# Patient Record
Sex: Male | Born: 1970 | Race: White | Hispanic: No | Marital: Married | Smoking: Former smoker
Health system: Southern US, Community
[De-identification: ages and names within clinical notes are randomized; demographics above are authoritative.]

## PROBLEM LIST (undated history)

## (undated) DIAGNOSIS — Z789 Other specified health status: Secondary | ICD-10-CM

## (undated) HISTORY — PX: FRACTURE SURGERY: SHX138

## (undated) HISTORY — PX: HERNIA REPAIR: SHX51

---

## 2016-04-22 ENCOUNTER — Encounter: Payer: Self-pay | Admitting: Family Medicine

## 2016-04-22 ENCOUNTER — Ambulatory Visit (INDEPENDENT_AMBULATORY_CARE_PROVIDER_SITE_OTHER): Payer: BLUE CROSS/BLUE SHIELD | Admitting: Family Medicine

## 2016-04-22 ENCOUNTER — Ambulatory Visit (INDEPENDENT_AMBULATORY_CARE_PROVIDER_SITE_OTHER)
Admission: RE | Admit: 2016-04-22 | Discharge: 2016-04-22 | Disposition: A | Discharge status: Home / Self Care | Payer: BLUE CROSS/BLUE SHIELD | Source: Ambulatory Visit | Attending: Family Medicine | Admitting: Family Medicine

## 2016-04-22 VITALS — BP 110/80 | HR 80 | Temp 98.0°F | Ht 67.75 in | Wt 194.8 lb

## 2016-04-22 DIAGNOSIS — M25512 Pain in left shoulder: Secondary | ICD-10-CM

## 2016-04-22 NOTE — Progress Notes (Signed)
Pre visit review using our clinic review tool, if applicable. No additional management support is needed unless otherwise documented below in the visit note. 

## 2016-04-22 NOTE — Progress Notes (Signed)
Dr. Frederico Hamman T. Amarri Satterly, MD, [facility] [place], [address] Phone: [phone] Fax: [phone]  04/22/2016  Patient: David Norton, MRN: [id number], DOB: 03-09-1971, 45 y.o.  Primary Physician:  No primary care provider on file.   Chief Complaint  Patient presents with  . Shoulder Pain    Left   Subjective:   This 45 y.o. male patient noted above presents with shoulder pain that has been ongoing for 2 mo there is no history of trauma or accident recently The patient denies neck pain or radicular symptoms. Denies dislocation, subluxation, separation of the shoulder. The patient does complain of pain in the overhead plane with significant painful arc of motion.  Shoulder - hurt with external roation. Resting it is fine. Some pain in the front and has normal range of motion. No acute injury. 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   Will have to prop up his L shoulder.  6 weeks of persistent pain. Had some minor pain before then.   Medications Tried: Tylenol, NSAIDS Ice or Heat: minimally helpful Tried PT: No  Prior shoulder Injury: No Prior surgery: No Prior fracture: No  The PMH, PSH, Social History, Family History, Medications, and allergies have been reviewed in [facility], and have been updated if relevant.  There are no active problems to display for this patient.   History reviewed. No pertinent past medical history.  Past Surgical History  Procedure Laterality Date  . Hernia repair    . Fracture surgery      Social History   Social History  . Marital Status: Married    Spouse Name: N/A  . Number of Children: N/A  . Years of Education: N/A   Occupational History  . Not on file.   Social History Main Topics  . Smoking status: Former Research scientist (life sciences)  . Smokeless tobacco: Never Used  . Alcohol Use: 0.0 oz/week    0 Standard drinks or equivalent per week     Comment: rarely    . Drug Use: No  . Sexual Activity:    Partners: Female   Other Topics Concern  . Not on file   Social History Narrative   Bobybuilder and weightlifter   Married   Therapist, occupational business    Family History  Problem Relation Age of Onset  . Thyroid disease Mother   . Lung cancer Maternal Grandmother     No Known Allergies  Medication list reviewed and updated in full in [place].  GEN: No fevers, chills. Nontoxic. Primarily MSK c/o today. MSK: Detailed in the HPI GI: tolerating PO intake without difficulty Neuro: No numbness, parasthesias, or tingling associated. Otherwise the pertinent positives of the ROS are noted above.   Objective:   Blood pressure 110/80, pulse 80, temperature 98 F (36.7 C), temperature source Oral, height 5' 7.75" (1.721 m), weight 194 lb 12 oz (88.338 kg).  GEN: Well-developed,well-nourished,in no acute distress; alert,appropriate and cooperative throughout examination HEENT: Normocephalic and atraumatic without obvious abnormalities. Ears, externally no deformities PULM: Breathing comfortably in no respiratory distress EXT: No clubbing, cyanosis, or edema PSYCH: Normally interactive. Cooperative during the interview. Pleasant. Friendly and conversant. Not anxious or depressed appearing. Normal, full affect.  Shoulder: LEFT Inspection: No muscle wasting or winging Ecchymosis/edema: neg  AC joint, scapula, clavicle: NT Cervical spine: NT, full ROM Spurling's: neg Abduction: full, 5/5 Flexion: full, 5/5 IR, full, lift-off: 5/5 ER at neutral:  full, 5/5 AC crossover: neg Neer: neg Hawkins: pos Drop Test: neg Empty Can: pos Supraspinatus insertion: mild-mod T Bicipital groove: NT Speed's: neg Yergason's: neg OBRIENS NEG Sulcus sign: neg Scapular dyskinesis: When lowering the arms, there is a notable scapular shimmy and winging. Minimal winging but mild winging on pushup. 4 sloping scapula, tight pectoralis minor.  On the  left, patient does have decreased internal range of motion. Compared to the right. He also has relatively decreased external range of motion. This approaches the terminal end point of the contralateral side.  Lower traps and rhomboids are weak on the left compared to the right. 4 minus/5 strength. C5-T1 intact  Neuro: Sensation intact Grip 5/5   Radiology: Dg Shoulder Left  04/22/2016  CLINICAL DATA:  Left shoulder pain without history of trauma EXAM: LEFT SHOULDER - 2+ VIEW COMPARISON:  None in PACs FINDINGS: The bones are subjectively adequately mineralized. The glenohumeral joint space is preserved. The [facility] joint is unremarkable. The subacromial subdeltoid space is preserved. There is no acute fracture nor dislocation. IMPRESSION: There is no acute bony abnormality of the left shoulder. If rotator cuff or glenoid labral pathology is suspected clinically, MRI would be the most useful next imaging step. Electronically Signed   By: David  Martinique M.D.   On: 04/22/2016 13:11    Assessment and Plan:    Left shoulder pain - Plan: DG Shoulder Left  Rotator cuff tendinopathy with subacromial bursitis. 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  I reviewed some range of motion with the patient including pectoralis minor stretching and sleep are stretching as well as capsular stretching. I'm also going to have him do Blackburn's at least twice a week.   Follow-up: Return in about 3 weeks (around 05/13/2016).  Orders Placed This Encounter  Procedures  . DG Shoulder Left    Signed,  Frederico Hamman T. Morena Mckissack, MD   Patient's Medications   No medications on file

## 2016-05-12 ENCOUNTER — Ambulatory Visit: Payer: BLUE CROSS/BLUE SHIELD | Admitting: Family Medicine

## 2016-05-17 ENCOUNTER — Encounter: Payer: Self-pay | Admitting: Family Medicine

## 2016-05-17 ENCOUNTER — Ambulatory Visit (INDEPENDENT_AMBULATORY_CARE_PROVIDER_SITE_OTHER): Payer: BLUE CROSS/BLUE SHIELD | Admitting: Family Medicine

## 2016-05-17 VITALS — BP 104/60 | HR 70 | Temp 98.2°F | Ht 67.75 in | Wt 189.2 lb

## 2016-05-17 DIAGNOSIS — M25512 Pain in left shoulder: Secondary | ICD-10-CM | POA: Diagnosis not present

## 2016-05-17 NOTE — Progress Notes (Signed)
Pre visit review using our clinic review tool, if applicable. No additional management support is needed unless otherwise documented below in the visit note. 

## 2016-05-17 NOTE — Progress Notes (Signed)
Dr. Frederico Hamman T. Soraida Vickers, MD, [facility] and [facility] Medicine [place], [address] Phone: [phone] Fax: [phone]  05/17/2016  Patient: David Norton, MRN: [id number], DOB: [birth year], 45 y.o.  Primary Physician:  No primary care provider on file.   Chief Complaint  Patient presents with  . Follow-up    Left shoulder   Subjective:   F/u L shoulder:  L shoulder still hurting a lot ROM is a little better.  Chest workout bothering him a lot.   He tells me that his shoulder has felt unstable at times it felt like it was going to pop out of socket. These are with particular motions with abduction and in chest flexion. He has not had a true dislocation. He has primarily had pain doing significant chest workouts. He also has had some pain doing bicep workouts while on an inclined bench.  04/22/2016 Last OV with Owens Loffler, MD  This 45 y.o. male patient noted above presents with shoulder pain that has been ongoing for 2 mo there is no history of trauma or accident recently The patient denies neck pain or radicular symptoms. Denies dislocation, subluxation, separation of the shoulder. The patient does complain of pain in the overhead plane with significant painful arc of motion.  Shoulder - hurt with external roation. Resting it is fine. Some pain in the front and has normal range of motion. No acute injury. 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   Will have to prop up his L shoulder.  6 weeks of persistent pain. Had some minor pain before then.   Medications Tried: Tylenol, NSAIDS Ice or Heat: minimally helpful Tried PT: No  Prior shoulder Injury: No Prior surgery: No Prior fracture: No  The PMH, PSH, Social History, Family History, Medications, and allergies have been reviewed in [facility], and have been updated if relevant.  There are no active problems to display for this patient.   No past medical  history on file.  Past Surgical History  Procedure Laterality Date  . Hernia repair    . Fracture surgery      Social History   Social History  . Marital Status: Married    Spouse Name: N/A  . Number of Children: N/A  . Years of Education: N/A   Occupational History  . Not on file.   Social History Main Topics  . Smoking status: Former Research scientist (life sciences)  . Smokeless tobacco: Never Used  . Alcohol Use: 0.0 oz/week    0 Standard drinks or equivalent per week     Comment: rarely  . Drug Use: No  . Sexual Activity:    Partners: Female   Other Topics Concern  . Not on file   Social History Narrative   Bobybuilder and weightlifter   Married   Therapist, occupational business    Family History  Problem Relation Age of Onset  . Thyroid disease Mother   . Lung cancer Maternal Grandmother     No Known Allergies  Medication list reviewed and updated in full in [place].  GEN: No fevers, chills. Nontoxic. Primarily MSK c/o today. MSK: Detailed in the HPI GI: tolerating PO intake without difficulty Neuro: No numbness, parasthesias, or tingling associated. Otherwise the pertinent positives of the ROS are noted above.   Objective:   Blood pressure 104/60, pulse 70, temperature 98.2 F (36.8 C), temperature source Oral, height 5' 7.75" (1.721 m), weight 189 lb 4  oz (85.843 kg).  GEN: Well-developed,well-nourished,in no acute distress; alert,appropriate and cooperative throughout examination HEENT: Normocephalic and atraumatic without obvious abnormalities. Ears, externally no deformities PULM: Breathing comfortably in no respiratory distress EXT: No clubbing, cyanosis, or edema PSYCH: Normally interactive. Cooperative during the interview. Pleasant. Friendly and conversant. Not anxious or depressed appearing. Normal, full affect.  Shoulder: LEFT Inspection: No muscle wasting or winging Ecchymosis/edema: neg  AC joint, scapula, clavicle: NT Cervical spine: NT, full  ROM Spurling's: neg Abduction: full, 5/5 Flexion: full, 5/5 IR, full, lift-off: 5/5 ER at neutral: full, 5/5 AC crossover: neg Neer: neg Hawkins: pos Drop Test: neg Empty Can: pos Supraspinatus insertion: mild-mod T Bicipital groove: NT Speed's: neg Yergason's: neg OBRIENS markedly positive on the LEFT Sulcus sign: neg Scapular dyskinesis: When lowering the arms, there is a notable scapular shimmy and winging.  On the left, patient does have decreased internal range of motion. Compared to the right. He also has relatively decreased external range of motion. This approaches the terminal end point of the contralateral side.  Lower traps and rhomboids are weak on the left compared to the right. 4 minus/5 strength. C5-T1 intact  Neuro: Sensation intact Grip 5/5   Radiology: Dg Shoulder Left  04/22/2016  CLINICAL DATA:  Left shoulder pain without history of trauma EXAM: LEFT SHOULDER - 2+ VIEW COMPARISON:  None in PACs FINDINGS: The bones are subjectively adequately mineralized. The glenohumeral joint space is preserved. The [facility] joint is unremarkable. The subacromial subdeltoid space is preserved. There is no acute fracture nor dislocation. IMPRESSION: There is no acute bony abnormality of the left shoulder. If rotator cuff or glenoid labral pathology is suspected clinically, MRI would be the most useful next imaging step. Electronically Signed   By: David  Martinique M.D.   On: 04/22/2016 13:11     Assessment and Plan:    Left shoulder pain - Plan: Ambulatory referral to Physical Therapy  Given his history and examination I think that he very likely has a labral tear. We discussed the various treatment options at age 26, which would include continued conservative care, physical therapy, alteration of his lifting movements versus potentially obtaining an MR arthrogram. Also discussed with him arthroscopic labral surgery and its recovery.  At this point he would like to continue to be  conservative and do some physical therapy and rehabilitation over the next couple of months, which I think is reasonable.  Follow-up: Return in about 8 weeks (around 07/12/2016).  Orders Placed This Encounter  Procedures  . Ambulatory referral to Physical Therapy    Signed,  Frederico Hamman T. Amrie Gurganus, MD   Patient's Medications   No medications on file

## 2016-07-14 ENCOUNTER — Ambulatory Visit (INDEPENDENT_AMBULATORY_CARE_PROVIDER_SITE_OTHER): Payer: BLUE CROSS/BLUE SHIELD | Admitting: Family Medicine

## 2016-07-14 ENCOUNTER — Encounter: Payer: Self-pay | Admitting: Family Medicine

## 2016-07-14 VITALS — BP 100/64 | HR 70 | Temp 98.4°F | Ht 67.75 in | Wt 180.5 lb

## 2016-07-14 DIAGNOSIS — M25512 Pain in left shoulder: Secondary | ICD-10-CM | POA: Diagnosis not present

## 2016-07-14 NOTE — Patient Instructions (Signed)

## 2016-07-14 NOTE — Progress Notes (Signed)
Pre visit review using our clinic review tool, if applicable. No additional management support is needed unless otherwise documented below in the visit note. 

## 2016-07-14 NOTE — Progress Notes (Signed)
Dr. Frederico Hamman T. Korey Prashad, MD, [facility] Sports Medicine [place], [address] Phone: [phone] Fax: [phone]  07/14/2016  Patient: David Norton, MRN: [id number], DOB: 09/15/1971, 45 y.o.  Primary Physician:  No primary care provider on file.   Chief Complaint  Patient presents with  . Follow-up    Left Shoulder   Subjective:   David Norton is a 45 y.o. very pleasant male patient who presents with the following:  F/u L shoulder: 5 months of shoulder pain, seen initially 04/22/2016. as doing ok and last night it was miracle - PT has been helping. ROM has increased. Pain is still there.   Yest shoulder workout yest, last night was bad. Really bad.  Still cannot do a pull-up.  Altering a lot of his lifts, and while better, still not at baseline.  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  [place] - next weekend.   Past Medical History, Surgical History, Social History, Family History, Problem List, Medications, and Allergies have been reviewed and updated if relevant.  There are no active problems to display for this patient.   No past medical history on file.  Past Surgical History:  Procedure Laterality Date  . FRACTURE SURGERY    . HERNIA REPAIR      Social History   Social History  . Marital status: Married    Spouse name: N/A  . Number of children: N/A  . Years of education: N/A   Occupational History  . Not on file.   Social History Main Topics  . Smoking status: Former Research scientist (life sciences)  . Smokeless tobacco: Never Used  . Alcohol use 0.0 oz/week     Comment: rarely  . Drug use: No  . Sexual activity: Yes    Partners: Female   Other Topics Concern  . Not on file   Social History Narrative   Bobybuilder and weightlifter   Married   Therapist, occupational business    Family History  Problem Relation Age of Onset  . Thyroid disease Mother   . Lung cancer Maternal Grandmother     No Known  Allergies  Medication list reviewed and updated in full in [place].  GEN: No fevers, chills. Nontoxic. Primarily MSK c/o today. MSK: Detailed in the HPI GI: tolerating PO intake without difficulty Neuro: No numbness, parasthesias, or tingling associated. Otherwise the pertinent positives of the ROS are noted above.   Objective:   BP 100/64   Pulse 70   Temp 98.4 F (36.9 C) (Oral)   Ht 5' 7.75" (1.721 m)   Wt 180 lb 8 oz (81.9 kg)   BMI 27.65 kg/m    GEN: WDWN, NAD, Non-toxic, Alert & Oriented x 3 HEENT: Atraumatic, Normocephalic.  Ears and Nose: No external deformity. EXTR: No clubbing/cyanosis/edema NEURO: Normal gait.  PSYCH: Normally interactive. Conversant. Not depressed or anxious appearing.  Calm demeanor.   Shoulder: LEFT Inspection: No muscle wasting or winging Ecchymosis/edema: neg  AC joint, scapula, clavicle: NT Cervical spine: NT, full ROM Abduction: full, 5/5 Flexion: full, 5/5 IR, full, lift-off: 5/5 ER at neutral: full, 5/5 AC crossover and compression: neg Neer: neg Hawkins: mild pos Drop Test: neg Empty Can: neg Supraspinatus insertion: NT Bicipital groove: NT Sulcus sign: neg Apprehension: neg O'Brien's: POS Jobe Relocation: neg Crank: POS Load and shift laxity: Scapular dyskinesis: none    Radiology: Dg Shoulder Left  Result Date: 04/22/2016 CLINICAL DATA:  Left shoulder pain without history of trauma  EXAM: LEFT SHOULDER - 2+ VIEW COMPARISON:  None in PACs FINDINGS: The bones are subjectively adequately mineralized. The glenohumeral joint space is preserved. The [facility] joint is unremarkable. The subacromial subdeltoid space is preserved. There is no acute fracture nor dislocation. IMPRESSION: There is no acute bony abnormality of the left shoulder. If rotator cuff or glenoid labral pathology is suspected clinically, MRI would be the most useful next imaging step. Electronically Signed   By: David  Martinique M.D.   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 He is having mechanical symptoms, with a positive O'Briens test and a positive clunk test. Obtain an MR arthrogram of the left shoulder to evaluate or potential labral tear, SLAP lesion, rotator cuff tear, or other shoulder pathology.  Follow-up: No Follow-up on file.  Orders Placed This Encounter  Procedures  . MR Shoulder Left W Contrast  . DG FLUORO GUIDED NEEDLE PLC ASPIRATION/INJECTION LOC    Signed,  Pecola Haxton T. Halana Deisher, MD   Patient's Medications   No medications on file

## 2016-07-28 ENCOUNTER — Ambulatory Visit
Admission: RE | Admit: 2016-07-28 | Discharge: 2016-07-28 | Disposition: A | Discharge status: Home / Self Care | Payer: BLUE CROSS/BLUE SHIELD | Source: Ambulatory Visit | Attending: Family Medicine | Admitting: Family Medicine

## 2016-07-28 DIAGNOSIS — M25512 Pain in left shoulder: Secondary | ICD-10-CM

## 2016-07-28 MED ORDER — IOPAMIDOL (ISOVUE-M 200) INJECTION 41%
13.0000 mL | Freq: Once | INTRAMUSCULAR | Status: AC
  Administered 2016-07-28: 13 mL via INTRA_ARTICULAR

## 2016-08-01 HISTORY — PX: SHOULDER ARTHROSCOPY W/ LABRAL REPAIR: SHX2399

## 2016-08-02 ENCOUNTER — Other Ambulatory Visit: Payer: Self-pay | Admitting: Family Medicine

## 2016-08-02 DIAGNOSIS — M19019 Primary osteoarthritis, unspecified shoulder: Secondary | ICD-10-CM

## 2016-08-02 DIAGNOSIS — M25512 Pain in left shoulder: Secondary | ICD-10-CM

## 2016-08-02 DIAGNOSIS — S43432A Superior glenoid labrum lesion of left shoulder, initial encounter: Secondary | ICD-10-CM

## 2016-08-11 DIAGNOSIS — S43431A Superior glenoid labrum lesion of right shoulder, initial encounter: Secondary | ICD-10-CM | POA: Diagnosis not present

## 2016-09-14 DIAGNOSIS — M94212 Chondromalacia, left shoulder: Secondary | ICD-10-CM | POA: Diagnosis not present

## 2016-09-14 DIAGNOSIS — M7542 Impingement syndrome of left shoulder: Secondary | ICD-10-CM | POA: Diagnosis not present

## 2016-09-14 DIAGNOSIS — G8918 Other acute postprocedural pain: Secondary | ICD-10-CM | POA: Diagnosis not present

## 2016-09-14 DIAGNOSIS — S43432A Superior glenoid labrum lesion of left shoulder, initial encounter: Secondary | ICD-10-CM | POA: Diagnosis not present

## 2016-09-14 DIAGNOSIS — M24112 Other articular cartilage disorders, left shoulder: Secondary | ICD-10-CM | POA: Diagnosis not present

## 2016-09-20 DIAGNOSIS — Z9889 Other specified postprocedural states: Secondary | ICD-10-CM | POA: Diagnosis not present

## 2016-09-20 DIAGNOSIS — M25512 Pain in left shoulder: Secondary | ICD-10-CM | POA: Diagnosis not present

## 2016-09-20 DIAGNOSIS — Z4789 Encounter for other orthopedic aftercare: Secondary | ICD-10-CM | POA: Diagnosis not present

## 2016-09-22 DIAGNOSIS — M25512 Pain in left shoulder: Secondary | ICD-10-CM | POA: Diagnosis not present

## 2016-09-22 DIAGNOSIS — Z4789 Encounter for other orthopedic aftercare: Secondary | ICD-10-CM | POA: Diagnosis not present

## 2016-09-29 DIAGNOSIS — Z4789 Encounter for other orthopedic aftercare: Secondary | ICD-10-CM | POA: Diagnosis not present

## 2016-09-29 DIAGNOSIS — M25512 Pain in left shoulder: Secondary | ICD-10-CM | POA: Diagnosis not present

## 2016-10-05 DIAGNOSIS — Z4789 Encounter for other orthopedic aftercare: Secondary | ICD-10-CM | POA: Diagnosis not present

## 2016-10-05 DIAGNOSIS — M25512 Pain in left shoulder: Secondary | ICD-10-CM | POA: Diagnosis not present

## 2016-10-07 DIAGNOSIS — M25512 Pain in left shoulder: Secondary | ICD-10-CM | POA: Diagnosis not present

## 2016-10-07 DIAGNOSIS — Z4789 Encounter for other orthopedic aftercare: Secondary | ICD-10-CM | POA: Diagnosis not present

## 2016-10-13 DIAGNOSIS — M25512 Pain in left shoulder: Secondary | ICD-10-CM | POA: Diagnosis not present

## 2016-10-13 DIAGNOSIS — Z4789 Encounter for other orthopedic aftercare: Secondary | ICD-10-CM | POA: Diagnosis not present

## 2016-10-15 DIAGNOSIS — Z4789 Encounter for other orthopedic aftercare: Secondary | ICD-10-CM | POA: Diagnosis not present

## 2016-10-15 DIAGNOSIS — M25512 Pain in left shoulder: Secondary | ICD-10-CM | POA: Diagnosis not present

## 2016-10-19 DIAGNOSIS — M25512 Pain in left shoulder: Secondary | ICD-10-CM | POA: Diagnosis not present

## 2016-10-19 DIAGNOSIS — Z4789 Encounter for other orthopedic aftercare: Secondary | ICD-10-CM | POA: Diagnosis not present

## 2016-10-21 DIAGNOSIS — Z4789 Encounter for other orthopedic aftercare: Secondary | ICD-10-CM | POA: Diagnosis not present

## 2016-10-21 DIAGNOSIS — M25512 Pain in left shoulder: Secondary | ICD-10-CM | POA: Diagnosis not present

## 2016-10-29 DIAGNOSIS — Z9889 Other specified postprocedural states: Secondary | ICD-10-CM | POA: Diagnosis not present

## 2016-11-08 DIAGNOSIS — Z4789 Encounter for other orthopedic aftercare: Secondary | ICD-10-CM | POA: Diagnosis not present

## 2016-11-08 DIAGNOSIS — M25512 Pain in left shoulder: Secondary | ICD-10-CM | POA: Diagnosis not present

## 2016-11-11 DIAGNOSIS — Z4789 Encounter for other orthopedic aftercare: Secondary | ICD-10-CM | POA: Diagnosis not present

## 2016-11-11 DIAGNOSIS — M25512 Pain in left shoulder: Secondary | ICD-10-CM | POA: Diagnosis not present

## 2016-11-16 DIAGNOSIS — M25512 Pain in left shoulder: Secondary | ICD-10-CM | POA: Diagnosis not present

## 2016-11-16 DIAGNOSIS — Z4789 Encounter for other orthopedic aftercare: Secondary | ICD-10-CM | POA: Diagnosis not present

## 2016-11-22 DIAGNOSIS — Z4789 Encounter for other orthopedic aftercare: Secondary | ICD-10-CM | POA: Diagnosis not present

## 2016-11-22 DIAGNOSIS — M25512 Pain in left shoulder: Secondary | ICD-10-CM | POA: Diagnosis not present

## 2016-12-01 DIAGNOSIS — Z4789 Encounter for other orthopedic aftercare: Secondary | ICD-10-CM | POA: Diagnosis not present

## 2016-12-01 DIAGNOSIS — M25512 Pain in left shoulder: Secondary | ICD-10-CM | POA: Diagnosis not present

## 2016-12-10 DIAGNOSIS — Z9889 Other specified postprocedural states: Secondary | ICD-10-CM | POA: Diagnosis not present

## 2017-02-17 DIAGNOSIS — L570 Actinic keratosis: Secondary | ICD-10-CM | POA: Diagnosis not present

## 2017-02-17 DIAGNOSIS — D0439 Carcinoma in situ of skin of other parts of face: Secondary | ICD-10-CM | POA: Diagnosis not present

## 2017-02-17 DIAGNOSIS — D485 Neoplasm of uncertain behavior of skin: Secondary | ICD-10-CM | POA: Diagnosis not present

## 2017-08-25 DIAGNOSIS — L570 Actinic keratosis: Secondary | ICD-10-CM | POA: Diagnosis not present

## 2017-08-25 DIAGNOSIS — Z85828 Personal history of other malignant neoplasm of skin: Secondary | ICD-10-CM | POA: Diagnosis not present

## 2018-04-28 IMAGING — DX DG SHOULDER 2+V*L*
3 series · 3 of 3 positions shown · non-contrast
Comparison: None in PACs

CLINICAL DATA: Left shoulder pain without history of trauma

EXAM:
LEFT SHOULDER - 2+ VIEW

[shoulder axial]
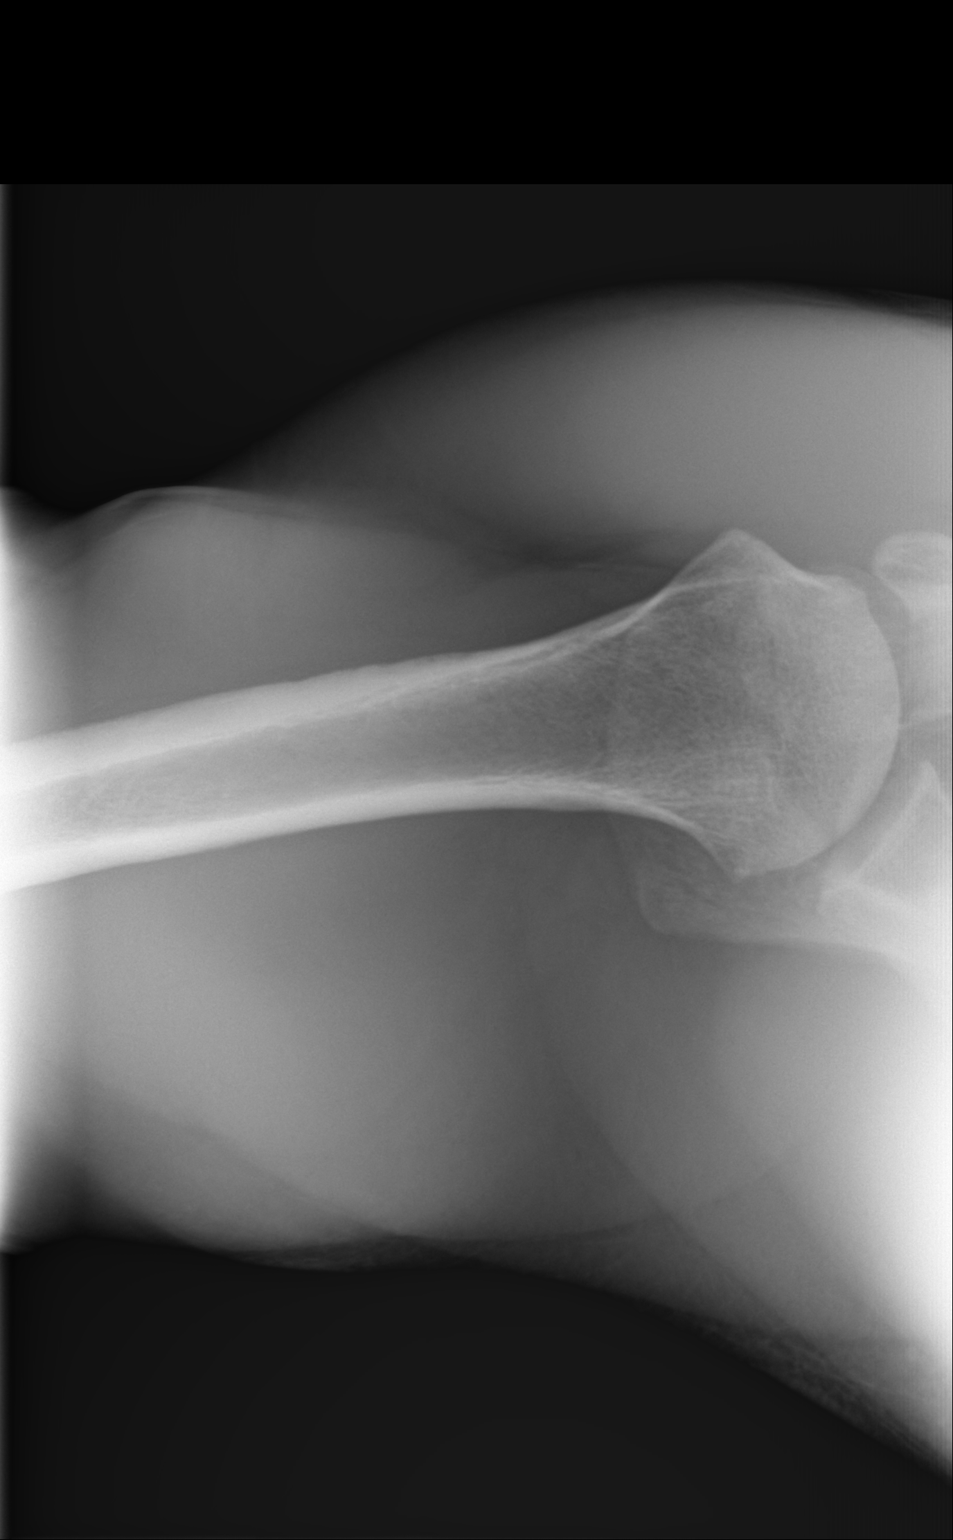

[shoulder ap]
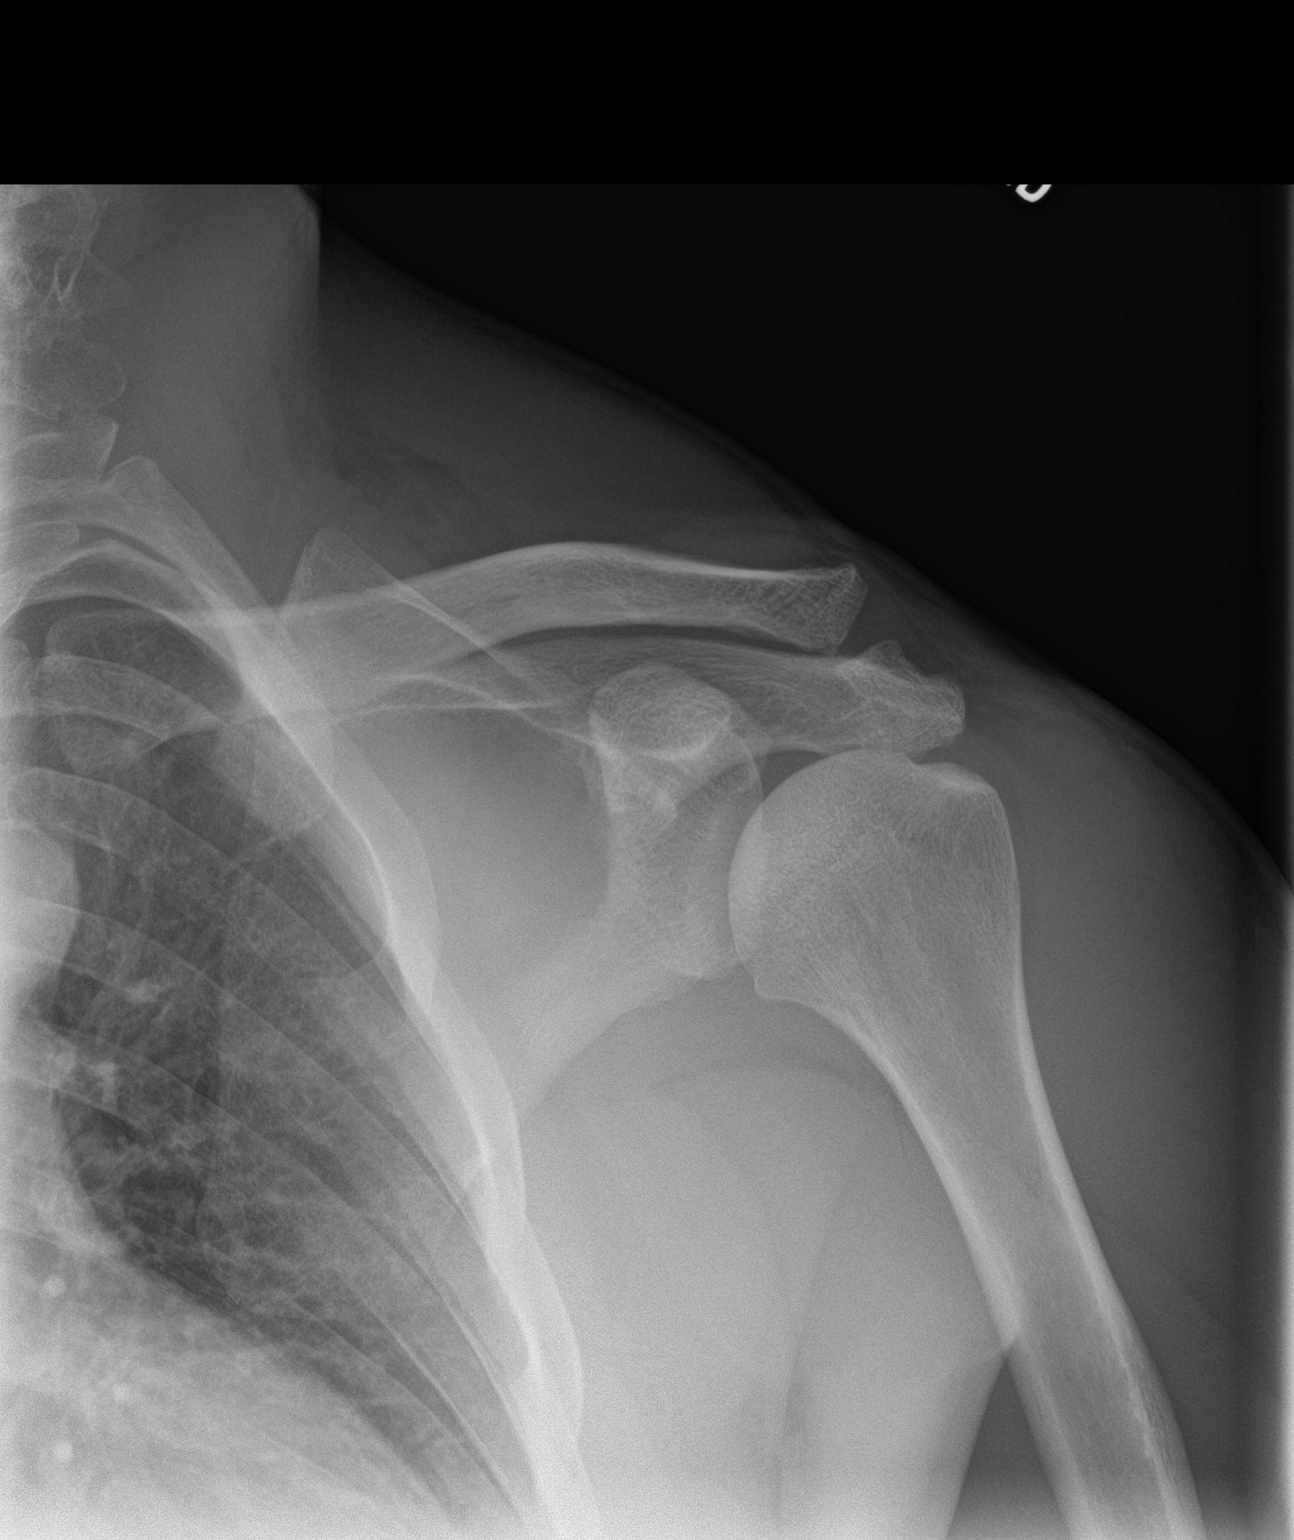

[shoulder y-view]
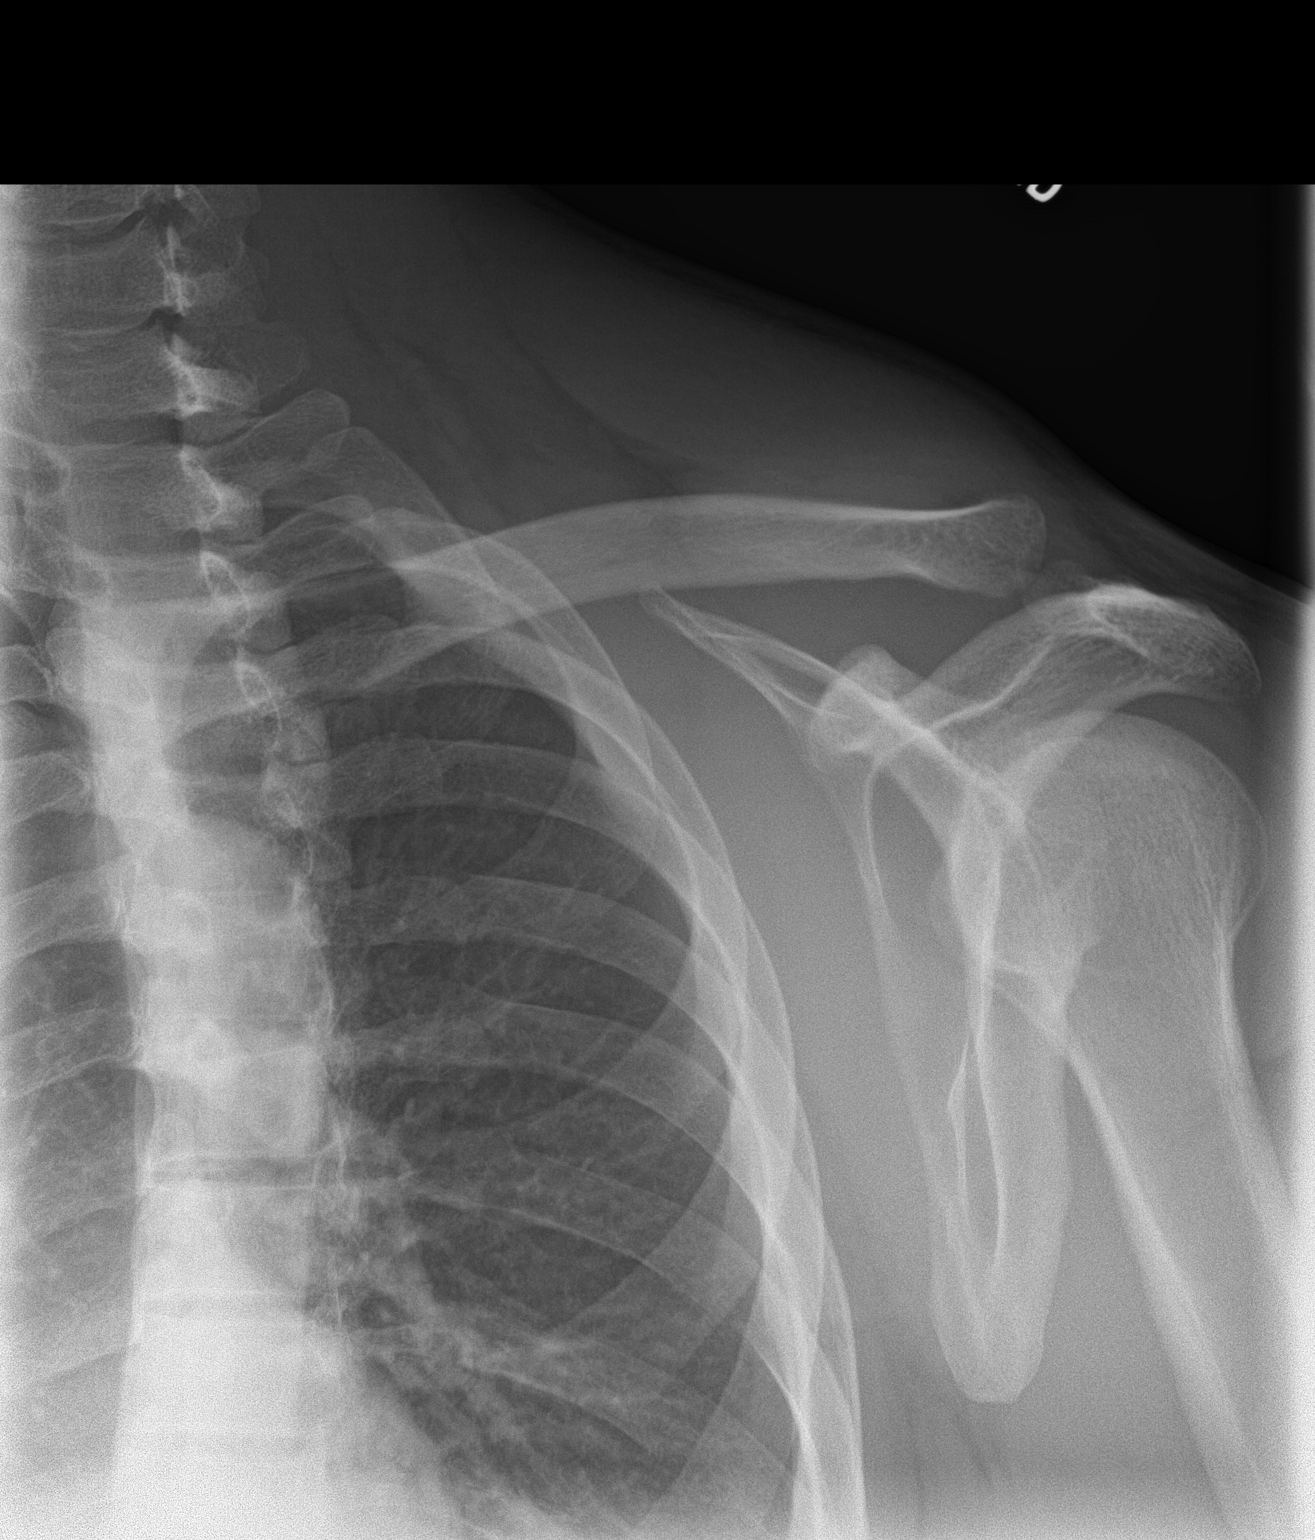

[3 of 3 positions shown; findings below may reference images not displayed]

FINDINGS: The bones are subjectively adequately mineralized. The glenohumeral
joint space is preserved. The AC joint is unremarkable. The
subacromial subdeltoid space is preserved. There is no acute
fracture nor dislocation.
IMPRESSION: There is no acute bony abnormality of the left shoulder. If rotator
cuff or glenoid labral pathology is suspected clinically, MRI would
be the most useful next imaging step.

## 2018-05-01 ENCOUNTER — Ambulatory Visit: Payer: Self-pay | Admitting: Podiatry

## 2018-05-11 ENCOUNTER — Ambulatory Visit (INDEPENDENT_AMBULATORY_CARE_PROVIDER_SITE_OTHER): Payer: BLUE CROSS/BLUE SHIELD | Admitting: Podiatry

## 2018-05-11 ENCOUNTER — Encounter: Payer: Self-pay | Admitting: Podiatry

## 2018-05-11 VITALS — BP 123/91 | HR 73

## 2018-05-11 DIAGNOSIS — M2041 Other hammer toe(s) (acquired), right foot: Secondary | ICD-10-CM

## 2018-05-11 DIAGNOSIS — L84 Corns and callosities: Secondary | ICD-10-CM | POA: Diagnosis not present

## 2018-05-11 NOTE — Progress Notes (Signed)
His patient presents to the office with chief complaint of a painful hangnail for over 3 years.  Patient states this is related to previous surgery for correction of a broken bone in his right rear foot.  He says the hardware was removed in 2005 and also believes this condition related to the fifth toe hangnail.  Patient states that he is having difficulty walking due to the painful skin lesion outside the fifth toenail right foot.  He says he has occasionally trimmed it down, but it has now returned and is very enlarged. He says it is extremely painful walking and wearing his shoes.  He presents the office today for an evaluation and treatment of this painful skin lesion fifth toe right foot  General Appearance  Alert, conversant and in no acute stress.  Vascular  Dorsalis pedis and posterior tibial  pulses are palpable  bilaterally.  Capillary return is within normal limits  bilaterally. Temperature is within normal limits  bilaterally.  Neurologic  Senn-Weinstein monofilament wire test within normal limits  bilaterally. Muscle power within normal limits bilaterally.  Nails Normotropic nail with no evidence of bacterial and fungal infection.  Orthopedic  No limitations of motion of motion feet .  No crepitus or effusions noted.  Hammertoe deformity, fifth toe right foot.  Hallux limitus first MPJ bilaterally.    Skin  normotropic skin with no porokeratosis noted bilaterally.  No signs of infections or ulcers noted.  Listers corn fifth toe right foot.   Listers corn secondary to adducto varus fifth digit right foot.  IE  Debride corn  Padding was dispensed.  Discussed this condition with this patient.  Told him I believe . this corn is more related to the hallux limitus deformity as opposed to the previous foot surgery.  Discussed conservative versus surgical correction.  Patient to return to the office in the future to discuss future treatment.   Gardiner Barefoot DPM

## 2018-07-12 ENCOUNTER — Ambulatory Visit (INDEPENDENT_AMBULATORY_CARE_PROVIDER_SITE_OTHER)
Admission: RE | Admit: 2018-07-12 | Discharge: 2018-07-12 | Disposition: A | Discharge status: Home / Self Care | Payer: BLUE CROSS/BLUE SHIELD | Source: Ambulatory Visit | Attending: Family Medicine | Admitting: Family Medicine

## 2018-07-12 ENCOUNTER — Ambulatory Visit: Payer: BLUE CROSS/BLUE SHIELD | Admitting: Family Medicine

## 2018-07-12 VITALS — BP 100/78 | HR 78 | Temp 98.5°F | Ht 67.75 in | Wt 186.5 lb

## 2018-07-12 DIAGNOSIS — S43001A Unspecified subluxation of right shoulder joint, initial encounter: Secondary | ICD-10-CM | POA: Diagnosis not present

## 2018-07-12 DIAGNOSIS — M25511 Pain in right shoulder: Secondary | ICD-10-CM

## 2018-07-12 DIAGNOSIS — G8929 Other chronic pain: Secondary | ICD-10-CM | POA: Diagnosis not present

## 2018-07-12 NOTE — Progress Notes (Signed)
Dr. Frederico Hamman T. Talyah Seder, MD, [facility] [place], [address] Phone: [phone] Fax: [phone]  07/12/2018  Patient: David Norton, MRN: [id number], DOB: 27-Apr-1971, 47 y.o.  Primary Physician:  Owens Loffler, MD   Chief Complaint  Patient presents with  . Shoulder Pain    Right   Subjective:   David Norton is a 47 y.o. very pleasant male patient who presents with the following:  R shoulder.  He is a very well-known gentleman who I recall from prior problems with his left shoulder.  He had a large labral tear on the left side, and we tried to rehab this conservatively several years ago, and he ultimately had an arthroscopic labral repair and debridement by Dr. Tamera Punt.  He has done well from this, and he is still been lifting weights and competing and bodybuilding.  He now has pain more in the right shoulder, and he said to adapt his lifting.  Approximately 10 days ago, he did feel as if his shoulder came out and then relocated on its own while he was in bed, and his wife said that he screamed at night.  He now feels somewhat weak and is having pain with abduction.  She having trouble doing bench presses as well as push-ups and overhead presses.  Is having a deep pain in his shoulder.  10/11 2017.     Past Medical History, Surgical History, Social History, Family History, Problem List, Medications, and Allergies have been reviewed and updated if relevant.  There are no active problems to display for this patient.   No past medical history on file.  Past Surgical History:  Procedure Laterality Date  . FRACTURE SURGERY    . HERNIA REPAIR      Social History   Socioeconomic History  . Marital status: Married    Spouse name: Not on file  . Number of children: Not on file  . Years of education: Not on file  . Highest education level: Not on file  Occupational History  . Not on file  Social Needs  .  Financial resource strain: Not on file  . Food insecurity:    Worry: Not on file    Inability: Not on file  . Transportation needs:    Medical: Not on file    Non-medical: Not on file  Tobacco Use  . Smoking status: Former Research scientist (life sciences)  . Smokeless tobacco: Never Used  Substance and Sexual Activity  . Alcohol use: Yes    Alcohol/week: 0.0 standard drinks    Comment: rarely  . Drug use: No  . Sexual activity: Yes    Partners: Female  Lifestyle  . Physical activity:    Days per week: Not on file    Minutes per session: Not on file  . Stress: Not on file  Relationships  . Social connections:    Talks on phone: Not on file    Gets together: Not on file    Attends religious service: Not on file    Active member of club or organization: Not on file    Attends meetings of clubs or organizations: Not on file    Relationship status: Not on file  . Intimate partner violence:    Fear of current or ex partner: Not on file    Emotionally abused: Not on file    Physically abused: Not on file    Forced sexual activity: Not on file  Other Topics Concern  .  Not on file  Social History Narrative   Bobybuilder and weightlifter   Married   Therapist, occupational business    Family History  Problem Relation Age of Onset  . Thyroid disease Mother   . Lung cancer Maternal Grandmother     No Known Allergies  Medication list reviewed and updated in full in [place].  GEN: No fevers, chills. Nontoxic. Primarily MSK c/o today. MSK: Detailed in the HPI GI: tolerating PO intake without difficulty Neuro: No numbness, parasthesias, or tingling associated. Otherwise the pertinent positives of the ROS are noted above.   Objective:   BP 100/78   Pulse 78   Temp 98.5 F (36.9 C) (Oral)   Ht 5' 7.75" (1.721 m)   Wt 186 lb 8 oz (84.6 kg)   BMI 28.57 kg/m    GEN: WDWN, NAD, Non-toxic, Alert & Oriented x 3 HEENT: Atraumatic, Normocephalic.  Ears and Nose: No external deformity. EXTR:  No clubbing/cyanosis/edema NEURO: Normal gait.  PSYCH: Normally interactive. Conversant. Not depressed or anxious appearing.  Calm demeanor.   Shoulder: R Inspection: No muscle wasting or winging Ecchymosis/edema: neg  AC joint, scapula, clavicle: NT Cervical spine: NT, full ROM Abduction: full, 5/5 Flexion: full, 4++/5 IR, full, lift-off: 5/5 ER at neutral: full, 5/5 AC crossover and compression: mild pos Neer: neg Hawkins: neg Drop Test: neg Empty Can: neg Supraspinatus insertion: NT Bicipital groove: NT Sulcus sign: neg Apprehension: neg O'Brien's: neg Jobe Relocation: neg Crank: POS and clunk + Load and shift laxity: none Scapular dyskinesis: none    Radiology: Dg Shoulder Right  Result Date: 07/13/2018 CLINICAL DATA:  RIGHT shoulder pain. EXAM: RIGHT SHOULDER - 2+ VIEW COMPARISON:  None. FINDINGS: There is no evidence of fracture or dislocation. There is no evidence of arthropathy or other focal bone abnormality. Soft tissues are unremarkable. IMPRESSION: Negative. Electronically Signed   By: Nolon Nations M.D.   On: 07/13/2018 08:41     Assessment and Plan:   Chronic right shoulder pain - Plan: DG Shoulder Right  Acquired subluxation of right shoulder, initial encounter  >25 minutes spent in face to face time with patient, >50% spent in counselling or coordination of care   Probable glenoid labral tear based on history and exam.  He certainly has a positive clunk and crank test.  O'Brien's test is negative.  He has a little bit of weakness currently after what is a very good history for subluxation at nighttime in bed.  I think he has a very good chance of being able to conservatively rehabilitate this, and the question is really is he going to be able to get it up to a level that is acceptable for him at a high level of body building and weight lifting.  He would like to do so without operative intervention if at all possible.  He is very knowledgeable, having  done a lot of rehab with his left shoulder, and he also is a Nurse, mental health.  Follow-up: Return in about 2 months (around 09/11/2018).  Orders Placed This Encounter  Procedures  . DG Shoulder Right    Signed,  Frederico Hamman T. Maison Kestenbaum, MD   Allergies as of 07/12/2018   No Known Allergies     Medication List    as of 07/12/2018 11:59 PM   You have not been prescribed any medications.

## 2018-07-13 ENCOUNTER — Encounter: Payer: Self-pay | Admitting: Family Medicine

## 2019-07-12 ENCOUNTER — Other Ambulatory Visit: Payer: Self-pay

## 2019-07-12 ENCOUNTER — Ambulatory Visit (INDEPENDENT_AMBULATORY_CARE_PROVIDER_SITE_OTHER): Payer: BC Managed Care – PPO

## 2019-07-12 ENCOUNTER — Ambulatory Visit (INDEPENDENT_AMBULATORY_CARE_PROVIDER_SITE_OTHER): Payer: BC Managed Care – PPO | Admitting: Podiatry

## 2019-07-12 ENCOUNTER — Encounter: Payer: Self-pay | Admitting: Podiatry

## 2019-07-12 DIAGNOSIS — M2022 Hallux rigidus, left foot: Secondary | ICD-10-CM | POA: Diagnosis not present

## 2019-07-12 DIAGNOSIS — M2021 Hallux rigidus, right foot: Secondary | ICD-10-CM

## 2019-07-12 DIAGNOSIS — M722 Plantar fascial fibromatosis: Secondary | ICD-10-CM

## 2019-07-12 DIAGNOSIS — M129 Arthropathy, unspecified: Secondary | ICD-10-CM

## 2019-07-12 DIAGNOSIS — M205X1 Other deformities of toe(s) (acquired), right foot: Secondary | ICD-10-CM | POA: Insufficient documentation

## 2019-07-12 MED ORDER — MELOXICAM 15 MG PO TABS: 15.0000 mg | ORAL_TABLET | Freq: Every day | ORAL | 0 refills | Status: DC

## 2019-07-12 NOTE — Progress Notes (Signed)
This patient returns to the office for an evaluation of both feet.  He says his feet are stiff and painful when he starts walking from a sitting position.  He says he has pain on the outside of his right foot.  Patient had previous rearfoot surgery including removal of hardware right foot.  He says he has pain along the arch left foot and points to a painful site at outside left foot.  He says he is experiencing pain on the top of both feet.  He says he was previously treated with orthoses right foot only.  He presents to the office for evaluation both feet.    Vascular  Dorsalis pedis and posterior tibial pulses are palpable  B/L.  Capillary return  WNL.  Temperature gradient is  WNL.  Skin turgor  WNL  Sensorium  Senn Weinstein monofilament wire  WNL. Normal tactile sensation.  Nail Exam  Patient has normal nails with no evidence of bacterial or fungal infection.  Orthopedic  Exam  Muscle tone and muscle strength  WNL.    No crepitus or joint effusion noted.  Foot type is unremarkable and digits show no abnormalities.  Functional hallux limitus noted 1st MPJ  B/L.  Patient was limited ROM STJ  Right foot.  Palpable pain STJ right foot.  Midfoot exostosis  B/l.  Pain along the course of plantar fascia left foot.  Excessive pronation noted.  Skin  No open lesions.  Normal skin texture and turgor. Listers corn  Right fifth toe.  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  Arthritic changes midfoot, and .  Arthritic changes noted STJ right foot. Discussed this condition with this patient.  Told the patient he has been developing arthritic changes in the joints of both feet for years.  He also has a functional hallux limitus which causes his tendinitis and plantar fasciitis.  Told him to make an appointment with Liliane Channel to acquire kinetic wedge orthoses for him to wear in his shoes.  Finally prescribed Mobic 15 mg tablets for this patient to  help control his pain and increase his range of motion   Gardiner Barefoot DPM

## 2019-07-16 ENCOUNTER — Other Ambulatory Visit: Payer: Self-pay | Admitting: Podiatry

## 2019-07-16 DIAGNOSIS — Z85828 Personal history of other malignant neoplasm of skin: Secondary | ICD-10-CM | POA: Diagnosis not present

## 2019-07-16 DIAGNOSIS — D485 Neoplasm of uncertain behavior of skin: Secondary | ICD-10-CM | POA: Diagnosis not present

## 2019-07-16 DIAGNOSIS — L570 Actinic keratosis: Secondary | ICD-10-CM | POA: Diagnosis not present

## 2019-07-16 DIAGNOSIS — X32XXXA Exposure to sunlight, initial encounter: Secondary | ICD-10-CM | POA: Diagnosis not present

## 2019-07-16 DIAGNOSIS — C44612 Basal cell carcinoma of skin of right upper limb, including shoulder: Secondary | ICD-10-CM | POA: Diagnosis not present

## 2019-07-16 DIAGNOSIS — M722 Plantar fascial fibromatosis: Secondary | ICD-10-CM

## 2019-07-16 DIAGNOSIS — Z08 Encounter for follow-up examination after completed treatment for malignant neoplasm: Secondary | ICD-10-CM | POA: Diagnosis not present

## 2019-07-23 DIAGNOSIS — C44612 Basal cell carcinoma of skin of right upper limb, including shoulder: Secondary | ICD-10-CM | POA: Diagnosis not present

## 2019-08-08 ENCOUNTER — Other Ambulatory Visit: Payer: Self-pay

## 2019-08-08 ENCOUNTER — Ambulatory Visit (INDEPENDENT_AMBULATORY_CARE_PROVIDER_SITE_OTHER): Payer: BC Managed Care – PPO | Admitting: Orthotics

## 2019-08-08 DIAGNOSIS — M205X2 Other deformities of toe(s) (acquired), left foot: Secondary | ICD-10-CM

## 2019-08-08 DIAGNOSIS — M205X1 Other deformities of toe(s) (acquired), right foot: Secondary | ICD-10-CM

## 2019-08-08 DIAGNOSIS — M722 Plantar fascial fibromatosis: Secondary | ICD-10-CM

## 2019-08-08 DIAGNOSIS — M129 Arthropathy, unspecified: Secondary | ICD-10-CM

## 2019-08-08 NOTE — Progress Notes (Signed)
Patient fitted today with deep heel cup f/ot and hug arch due to pes cavus foot type; also k-wedge b/l to help relieve pressure on 1st MPJ.

## 2019-08-10 ENCOUNTER — Other Ambulatory Visit: Payer: Self-pay | Admitting: Podiatry

## 2019-09-06 ENCOUNTER — Other Ambulatory Visit: Payer: Self-pay

## 2019-09-06 ENCOUNTER — Ambulatory Visit (INDEPENDENT_AMBULATORY_CARE_PROVIDER_SITE_OTHER): Payer: BC Managed Care – PPO | Admitting: Orthotics

## 2019-09-06 DIAGNOSIS — M205X2 Other deformities of toe(s) (acquired), left foot: Secondary | ICD-10-CM

## 2019-09-06 DIAGNOSIS — M722 Plantar fascial fibromatosis: Secondary | ICD-10-CM | POA: Diagnosis not present

## 2019-09-06 DIAGNOSIS — M129 Arthropathy, unspecified: Secondary | ICD-10-CM

## 2019-09-06 DIAGNOSIS — M205X1 Other deformities of toe(s) (acquired), right foot: Secondary | ICD-10-CM

## 2019-09-06 NOTE — Progress Notes (Signed)
Patient came in today to pick up custom made foot orthotics.  The goals were accomplished and the patient reported no dissatisfaction with said orthotics.  Patient was advised of breakin period and how to report any issues. 

## 2019-10-23 DIAGNOSIS — Z20828 Contact with and (suspected) exposure to other viral communicable diseases: Secondary | ICD-10-CM | POA: Diagnosis not present

## 2020-01-11 DIAGNOSIS — Z1152 Encounter for screening for COVID-19: Secondary | ICD-10-CM | POA: Diagnosis not present

## 2020-01-21 DIAGNOSIS — X32XXXA Exposure to sunlight, initial encounter: Secondary | ICD-10-CM | POA: Diagnosis not present

## 2020-01-21 DIAGNOSIS — D2261 Melanocytic nevi of right upper limb, including shoulder: Secondary | ICD-10-CM | POA: Diagnosis not present

## 2020-01-21 DIAGNOSIS — D485 Neoplasm of uncertain behavior of skin: Secondary | ICD-10-CM | POA: Diagnosis not present

## 2020-01-21 DIAGNOSIS — D225 Melanocytic nevi of trunk: Secondary | ICD-10-CM | POA: Diagnosis not present

## 2020-01-21 DIAGNOSIS — D2262 Melanocytic nevi of left upper limb, including shoulder: Secondary | ICD-10-CM | POA: Diagnosis not present

## 2020-01-21 DIAGNOSIS — D044 Carcinoma in situ of skin of scalp and neck: Secondary | ICD-10-CM | POA: Diagnosis not present

## 2020-01-21 DIAGNOSIS — L570 Actinic keratosis: Secondary | ICD-10-CM | POA: Diagnosis not present

## 2020-01-21 DIAGNOSIS — L821 Other seborrheic keratosis: Secondary | ICD-10-CM | POA: Diagnosis not present

## 2020-02-19 DIAGNOSIS — D044 Carcinoma in situ of skin of scalp and neck: Secondary | ICD-10-CM | POA: Diagnosis not present

## 2020-07-17 IMAGING — DX DG SHOULDER 2+V*R*
3 series · 3 of 3 positions shown · non-contrast
Comparison: None.

CLINICAL DATA: RIGHT shoulder pain.

EXAM:
RIGHT SHOULDER - 2+ VIEW

[shoulder axial]
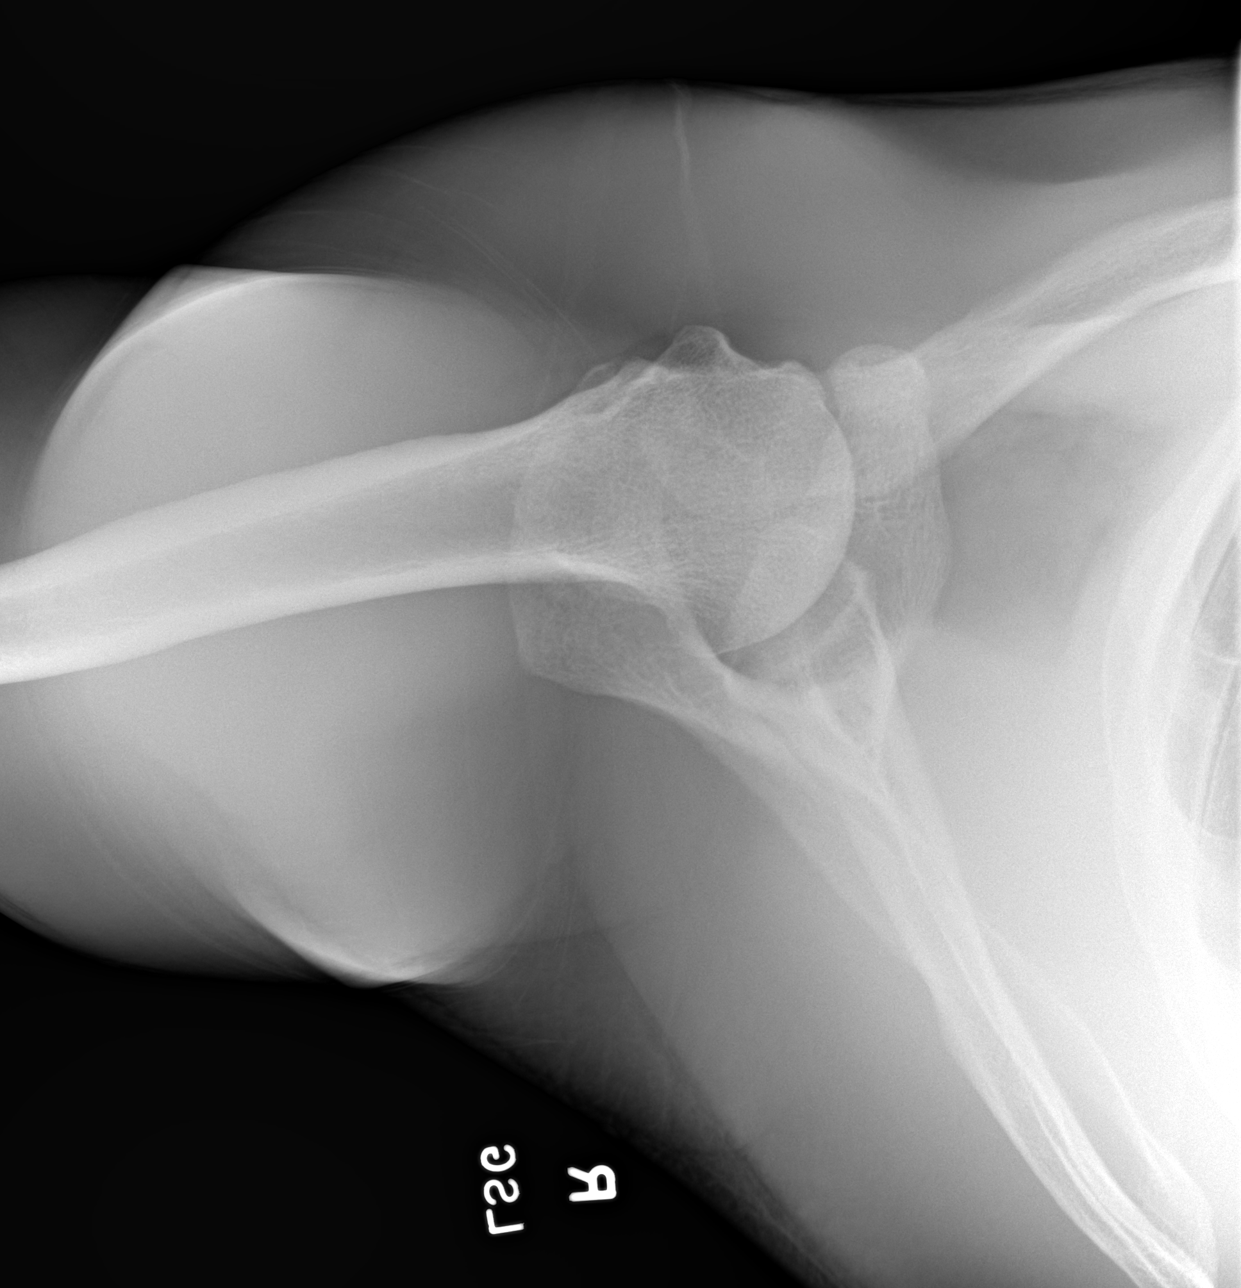

[shoulder ap]
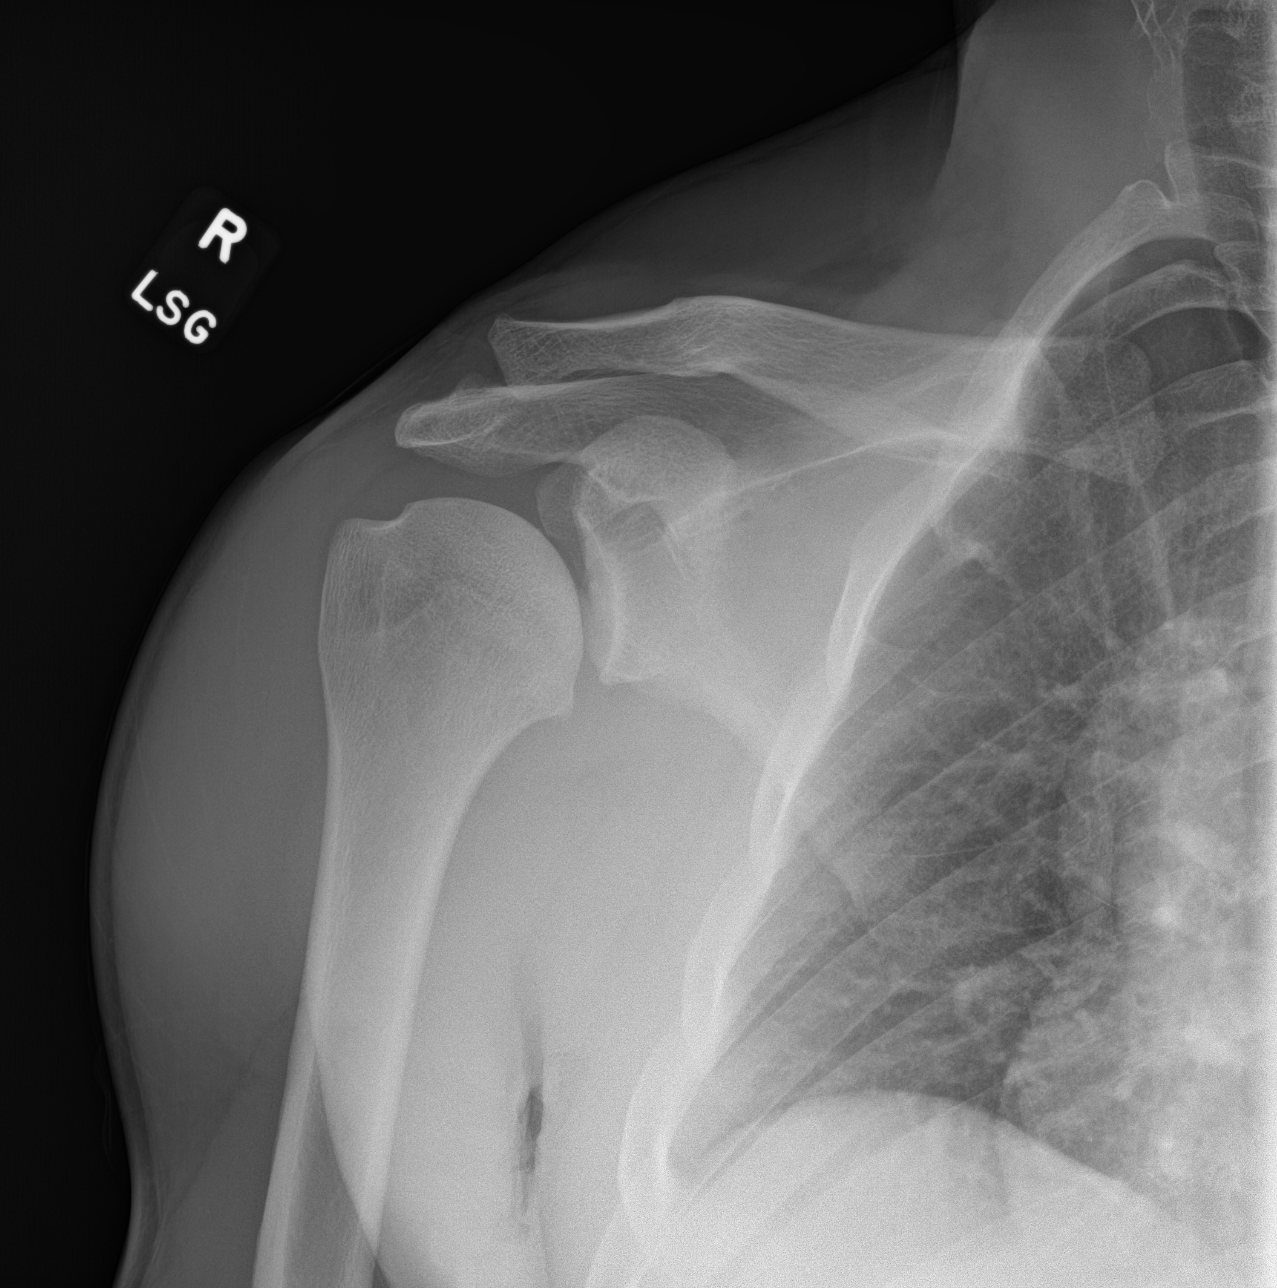

[shoulder y-view]
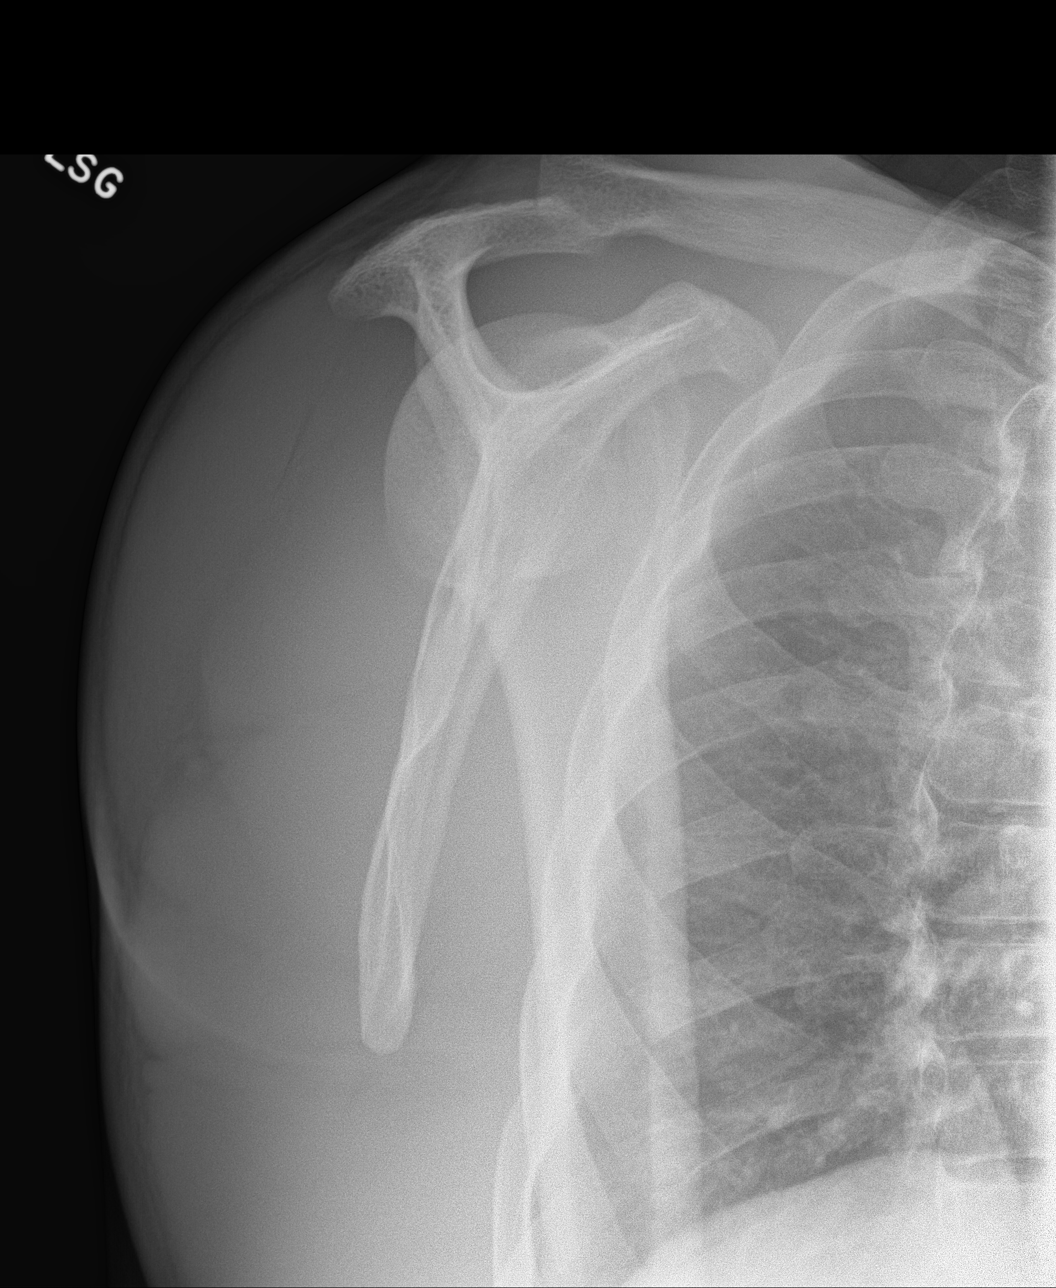

[3 of 3 positions shown; findings below may reference images not displayed]

FINDINGS: There is no evidence of fracture or dislocation. There is no
evidence of arthropathy or other focal bone abnormality. Soft
tissues are unremarkable.
IMPRESSION: Negative.

## 2020-09-09 DIAGNOSIS — D485 Neoplasm of uncertain behavior of skin: Secondary | ICD-10-CM | POA: Diagnosis not present

## 2020-09-09 DIAGNOSIS — D225 Melanocytic nevi of trunk: Secondary | ICD-10-CM | POA: Diagnosis not present

## 2020-09-09 DIAGNOSIS — Z85828 Personal history of other malignant neoplasm of skin: Secondary | ICD-10-CM | POA: Diagnosis not present

## 2020-09-09 DIAGNOSIS — D2262 Melanocytic nevi of left upper limb, including shoulder: Secondary | ICD-10-CM | POA: Diagnosis not present

## 2020-09-09 DIAGNOSIS — D0421 Carcinoma in situ of skin of right ear and external auricular canal: Secondary | ICD-10-CM | POA: Diagnosis not present

## 2020-09-09 DIAGNOSIS — D0462 Carcinoma in situ of skin of left upper limb, including shoulder: Secondary | ICD-10-CM | POA: Diagnosis not present

## 2020-09-09 DIAGNOSIS — D2261 Melanocytic nevi of right upper limb, including shoulder: Secondary | ICD-10-CM | POA: Diagnosis not present

## 2020-09-23 DIAGNOSIS — D0462 Carcinoma in situ of skin of left upper limb, including shoulder: Secondary | ICD-10-CM | POA: Diagnosis not present

## 2020-10-20 DIAGNOSIS — S9001XA Contusion of right ankle, initial encounter: Secondary | ICD-10-CM | POA: Diagnosis not present

## 2020-10-21 DIAGNOSIS — D0421 Carcinoma in situ of skin of right ear and external auricular canal: Secondary | ICD-10-CM | POA: Diagnosis not present

## 2022-01-25 ENCOUNTER — Encounter: Payer: Self-pay | Admitting: Family Medicine

## 2022-01-25 ENCOUNTER — Ambulatory Visit (INDEPENDENT_AMBULATORY_CARE_PROVIDER_SITE_OTHER): Payer: BC Managed Care – PPO | Admitting: Family Medicine

## 2022-01-25 ENCOUNTER — Other Ambulatory Visit: Payer: Self-pay

## 2022-01-25 VITALS — BP 104/76 | HR 65 | Temp 98.1°F | Ht 67.5 in | Wt 184.3 lb

## 2022-01-25 DIAGNOSIS — Z131 Encounter for screening for diabetes mellitus: Secondary | ICD-10-CM | POA: Diagnosis not present

## 2022-01-25 DIAGNOSIS — Z114 Encounter for screening for human immunodeficiency virus [HIV]: Secondary | ICD-10-CM | POA: Diagnosis not present

## 2022-01-25 DIAGNOSIS — R5383 Other fatigue: Secondary | ICD-10-CM | POA: Diagnosis not present

## 2022-01-25 DIAGNOSIS — Z23 Encounter for immunization: Secondary | ICD-10-CM

## 2022-01-25 DIAGNOSIS — Z13220 Encounter for screening for lipoid disorders: Secondary | ICD-10-CM

## 2022-01-25 DIAGNOSIS — Z1159 Encounter for screening for other viral diseases: Secondary | ICD-10-CM

## 2022-01-25 DIAGNOSIS — Z1211 Encounter for screening for malignant neoplasm of colon: Secondary | ICD-10-CM | POA: Diagnosis not present

## 2022-01-25 DIAGNOSIS — Z0000 Encounter for general adult medical examination without abnormal findings: Secondary | ICD-10-CM

## 2022-01-25 DIAGNOSIS — Z125 Encounter for screening for malignant neoplasm of prostate: Secondary | ICD-10-CM

## 2022-01-25 LAB — BASIC METABOLIC PANEL
BUN: 20 mg/dL (ref 6–23)
CO2: 30 mEq/L (ref 19–32)
Calcium: 9 mg/dL (ref 8.4–10.5)
Chloride: 101 mEq/L (ref 96–112)
Creatinine, Ser: 1.3 mg/dL (ref 0.40–1.50)
GFR: 63.28 mL/min (ref >60.00)
Glucose, Bld: 95 mg/dL (ref 70–99)
Potassium: 4.4 mEq/L (ref 3.5–5.1)
Sodium: 138 mEq/L (ref 135–145)

## 2022-01-25 LAB — CBC WITH DIFFERENTIAL/PLATELET
Basophils Absolute: 0 10*3/uL (ref 0.0–0.1)
Basophils Relative: 0.6 % (ref 0.0–3.0)
Eosinophils Absolute: 0.1 10*3/uL (ref 0.0–0.7)
Eosinophils Relative: 1.3 % (ref 0.0–5.0)
HCT: 51.2 % (ref 39.0–52.0)
Hemoglobin: 17.2 g/dL — ABNORMAL HIGH (ref 13.0–17.0)
Lymphocytes Relative: 24.2 % (ref 12.0–46.0)
Lymphs Abs: 1.2 10*3/uL (ref 0.7–4.0)
MCHC: 33.6 g/dL (ref 30.0–36.0)
MCV: 92.4 fl (ref 78.0–100.0)
Monocytes Absolute: 0.5 10*3/uL (ref 0.1–1.0)
Monocytes Relative: 10.2 % (ref 3.0–12.0)
Neutro Abs: 3.3 10*3/uL (ref 1.4–7.7)
Neutrophils Relative %: 63.7 % (ref 43.0–77.0)
Platelets: 223 10*3/uL (ref 150.0–400.0)
RBC: 5.54 Mil/uL (ref 4.22–5.81)
RDW: 15.2 % (ref 11.5–15.5)
WBC: 5.1 10*3/uL (ref 4.0–10.5)

## 2022-01-25 LAB — LIPID PANEL
Cholesterol: 173 mg/dL (ref 0–200)
HDL: 39.5 mg/dL (ref >39.00)
LDL Cholesterol: 118 mg/dL — ABNORMAL HIGH (ref 0–99)
NonHDL: 133.99
Total CHOL/HDL Ratio: 4
Triglycerides: 79 mg/dL (ref 0.0–149.0)
VLDL: 15.8 mg/dL (ref 0.0–40.0)

## 2022-01-25 LAB — HEMOGLOBIN A1C: Hgb A1c MFr Bld: 5.3 % (ref 4.6–6.5)

## 2022-01-25 LAB — HEPATIC FUNCTION PANEL
ALT: 38 U/L (ref 0–53)
AST: 28 U/L (ref 0–37)
Albumin: 4.2 g/dL (ref 3.5–5.2)
Alkaline Phosphatase: 58 U/L (ref 39–117)
Bilirubin, Direct: 0.1 mg/dL (ref 0.0–0.3)
Total Bilirubin: 0.6 mg/dL (ref 0.2–1.2)
Total Protein: 6.5 g/dL (ref 6.0–8.3)

## 2022-01-25 NOTE — Progress Notes (Signed)
? ? ?David Tallo T. David Graves, MD, [facility] ?Therapist, music at [facility] ?[place] ?[place], [address] ? ?Phone: [phone]  FAX: [phone] ? ?[name] - 51 y.o. male  MRN [id number]  Date of Birth: 02/15/1971 ? ?Date: 01/25/2022  PCP: Owens Loffler, MD  Referral: Owens Loffler, MD ? ?Chief Complaint  ?Patient presents with  ? Annual Exam  ? ? ?This visit occurred during the SARS-CoV-2 public health emergency.  Safety protocols were in place, including screening questions prior to the visit, additional usage of staff PPE, and extensive cleaning of exam room while observing appropriate contact time as indicated for disinfecting solutions.  ? ?Patient Care Team: ?Owens Loffler, MD as PCP - General (Family Medicine) ?Subjective:  ? ?David Norton is a 51 y.o. pleasant patient who presents with the following: ? ?Preventative Health Maintenance Visit: ? ?Health Maintenance Summary Reviewed and updated, unless pt declines services.  He has not had a general physical in multiple years. ? ?Tobacco History Reviewed. ?Alcohol: No concerns, no excessive use - rare. ?Exercise Habits: He works out daily, with cardio and a lot of resistance.   ?STD concerns: no risk or activity to increase risk ?Drug Use: None ? ?- declines Covid vaccine ? ?He is open to general HIV and hep C testing ?HIV ?Hep C ?Colonoscopy -  ? ?Working out a lot, lifting a lot.  Some aches and pains, but he is still able to do much of what he would like to do. ? ?Tdap ?Shingrix ? ?He overall feels good for age, and he has no ongoing major complaints. ? ?Health Maintenance  ?Topic Date Due  ? COVID-19 Vaccine (1) Never done  ? Hepatitis C Screening  Never done  ? COLONOSCOPY (Pts 45-89yr Insurance coverage will need to be confirmed)  Never done  ? INFLUENZA VACCINE  01/29/2022 (Originally 06/01/2021)  ? Zoster Vaccines- Shingrix (2 of 2) 03/22/2022  ? TETANUS/TDAP  01/26/2032  ? HIV Screening  Completed  ?  HPV VACCINES  Aged Out  ? ?Immunization History  ?Administered Date(s) Administered  ? Tdap 01/25/2022  ? Zoster Recombinat (Shingrix) 01/25/2022  ? ?Patient Active Problem List  ? Diagnosis Date Noted  ? Acquired hallux limitus of both feet 07/12/2019  ? Chronic arthropathy 07/12/2019  ? Plantar fasciitis, bilateral 07/12/2019  ? ? ?No past medical history on file. ? ?Past Surgical History:  ?Procedure Laterality Date  ? FRACTURE SURGERY    ? HERNIA REPAIR    ? SHOULDER ARTHROSCOPY W/ LABRAL REPAIR Left 08/2016  ? Dr. CTamera Punt ? ? ?Family History  ?Problem Relation Age of Onset  ? Thyroid disease Mother   ? Lung cancer Maternal Grandmother   ? ? ?Past Medical History, Surgical History, Social History, Family History, Problem List, Medications, and Allergies have been reviewed and updated if relevant. ? ?Review of Systems: Pertinent positives are listed above.  Otherwise, a full 14 point review of systems has been done in full and it is negative except where it is noted positive. ? ?Objective:  ? ?BP 104/76   Pulse 65   Temp 98.1 ?F (36.7 ?C) (Oral)   Ht 5' 7.5" (1.715 m)   Wt 184 lb 5 oz (83.6 kg)   SpO2 95%   BMI 28.44 kg/m?  ?Ideal Body Weight: Weight in (lb) to have BMI = 25: 161.7 ? ?Ideal Body Weight: Weight in (lb) to have BMI = 25: 161.7 ?No results found. ? ?  01/25/2022  ?  9:50 AM  ?Depression screen PHQ 2/9  ?Decreased Interest 0  ?Down, Depressed, Hopeless 0  ?PHQ - 2 Score 0  ? ? ? ?GEN: well developed, well nourished, no acute distress ?Eyes: conjunctiva and lids normal, PERRLA, EOMI ?ENT: TM clear, nares clear, oral exam WNL ?Neck: supple, no lymphadenopathy, no thyromegaly, no JVD ?Pulm: clear to auscultation and percussion, respiratory effort normal ?CV: regular rate and rhythm, S1-S2, no murmur, rub or gallop, no bruits, peripheral pulses normal and symmetric, no cyanosis, clubbing, edema or varicosities ?GI: soft, non-tender; no hepatosplenomegaly, masses; active bowel sounds all  quadrants ?GU: deferred ?Lymph: no cervical, axillary or inguinal adenopathy ?MSK: gait normal, muscle tone and strength WNL, no joint swelling, effusions, discoloration, crepitus  ?SKIN: clear, good turgor, color WNL, no rashes, lesions, or ulcerations ?Neuro: normal mental status, normal strength, sensation, and motion ?Psych: alert; oriented to person, place and time, normally interactive and not anxious or depressed in appearance. ? ?All labs reviewed with patient. ?Results for orders placed or performed in visit on 01/25/22  ?Basic metabolic panel  ?Result Value Ref Range  ? Sodium 138 135 - 145 mEq/L  ? Potassium 4.4 3.5 - 5.1 mEq/L  ? Chloride 101 96 - 112 mEq/L  ? CO2 30 19 - 32 mEq/L  ? Glucose, Bld 95 70 - 99 mg/dL  ? BUN 20 6 - 23 mg/dL  ? Creatinine, Ser 1.30 0.40 - 1.50 mg/dL  ? GFR 63.28 >60.00 mL/min  ? Calcium 9.0 8.4 - 10.5 mg/dL  ?CBC with Differential/Platelet  ?Result Value Ref Range  ? WBC 5.1 4.0 - 10.5 K/uL  ? RBC 5.54 4.22 - 5.81 Mil/uL  ? Hemoglobin 17.2 (H) 13.0 - 17.0 g/dL  ? HCT 51.2 39.0 - 52.0 %  ? MCV 92.4 78.0 - 100.0 fl  ? MCHC 33.6 30.0 - 36.0 g/dL  ? RDW 15.2 11.5 - 15.5 %  ? Platelets 223.0 150.0 - 400.0 K/uL  ? Neutrophils Relative % 63.7 43.0 - 77.0 %  ? Lymphocytes Relative 24.2 12.0 - 46.0 %  ? Monocytes Relative 10.2 3.0 - 12.0 %  ? Eosinophils Relative 1.3 0.0 - 5.0 %  ? Basophils Relative 0.6 0.0 - 3.0 %  ? Neutro Abs 3.3 1.4 - 7.7 K/uL  ? Lymphs Abs 1.2 0.7 - 4.0 K/uL  ? Monocytes Absolute 0.5 0.1 - 1.0 K/uL  ? Eosinophils Absolute 0.1 0.0 - 0.7 K/uL  ? Basophils Absolute 0.0 0.0 - 0.1 K/uL  ?Hepatic function panel  ?Result Value Ref Range  ? Total Bilirubin 0.6 0.2 - 1.2 mg/dL  ? Bilirubin, Direct 0.1 0.0 - 0.3 mg/dL  ? Alkaline Phosphatase 58 39 - 117 U/L  ? AST 28 0 - 37 U/L  ? ALT 38 0 - 53 U/L  ? Total Protein 6.5 6.0 - 8.3 g/dL  ? Albumin 4.2 3.5 - 5.2 g/dL  ?Hemoglobin A1c  ?Result Value Ref Range  ? Hgb A1c MFr Bld 5.3 4.6 - 6.5 %  ?Lipid panel  ?Result Value Ref  Range  ? Cholesterol 173 0 - 200 mg/dL  ? Triglycerides 79.0 0.0 - 149.0 mg/dL  ? HDL 39.50 >39.00 mg/dL  ? VLDL 15.8 0.0 - 40.0 mg/dL  ? LDL Cholesterol 118 (H) 0 - 99 mg/dL  ? Total CHOL/HDL Ratio 4   ? NonHDL 133.99   ? ? ?Assessment and Plan:  ? ?  ICD-10-CM   ?1. Healthcare maintenance  Z00.00 Tdap vaccine greater than or equal to 7yo  IM  ?  Varicella-zoster vaccine IM (Shingrix)  ?  ?2. Screening for malignant neoplasm of colon  Z12.11 Ambulatory referral to Gastroenterology  ?  ?3. Screening for HIV (human immunodeficiency virus)  Z11.4 HIV Antibody (routine testing w rflx)  ?  ?4. Need for hepatitis C screening test  Z11.59 Hepatitis C antibody  ?  ?5. Screening for diabetes mellitus  Z13.1 Hemoglobin A1c  ?  ?6. Screening, lipid  Z13.220 Lipid panel  ?  ?7. Screening PSA (prostate specific antigen)  Z12.5 PSA, Total with Reflex to PSA, Free  ?  ?8. Other fatigue  H88.50 Basic metabolic panel  ?  CBC with Differential/Platelet  ?  Hepatic function panel  ?  ?9. Need for Tdap vaccination  Z23 Tdap vaccine greater than or equal to 7yo IM  ?  ?10. Need for shingles vaccine  Z23 Varicella-zoster vaccine IM (Shingrix)  ?  ? ?Bring up-to-date on general health maintenance. ? ?Tdap and shingles today. ?Obtain all labs. ? ?Colonoscopy referral. ? ?Side from this, he is really doing well.  He still works out a lot, low percentage body fat.  I will just see him in 1 year. ? ?Health Maintenance Exam: The patient's preventative maintenance and recommended screening tests for an annual wellness exam were reviewed in full today. ?Brought up to date unless services declined. ? ?Counselled on the importance of diet, exercise, and its role in overall health and mortality. ?The patient's FH and SH was reviewed, including their home life, tobacco status, and drug and alcohol status. ? ?Follow-up in 1 year for physical exam or additional follow-up below. ? ?Follow-up: Return in about 1 year (around 01/26/2023) for Complete  Physical. ?Or follow-up in 1 year if not noted. ? ?No orders of the defined types were placed in this encounter. ? ?Medications Discontinued During This Encounter  ?Medication Reason  ? meloxicam (MOBIC) 15 MG tablet Completed Co

## 2022-01-26 LAB — HIV ANTIBODY (ROUTINE TESTING W REFLEX): HIV 1&2 Ab, 4th Generation: NONREACTIVE

## 2022-01-26 LAB — PSA, TOTAL WITH REFLEX TO PSA, FREE: PSA, Total: 0.5 ng/mL (ref <=4.0)

## 2022-01-26 LAB — HEPATITIS C ANTIBODY
Hepatitis C Ab: NONREACTIVE
SIGNAL TO CUT-OFF: <0.02 (ref <1.00)

## 2022-01-27 ENCOUNTER — Other Ambulatory Visit: Payer: Self-pay

## 2022-01-27 DIAGNOSIS — Z1211 Encounter for screening for malignant neoplasm of colon: Secondary | ICD-10-CM

## 2022-01-27 MED ORDER — SUTAB 1479-225-188 MG PO TABS: 12.0000 | ORAL_TABLET | Freq: Once | ORAL | 0 refills | Status: AC

## 2022-01-27 NOTE — Progress Notes (Signed)
Gastroenterology Pre-Procedure Review ? ?Request Date: 02/24/2022 ?Requesting Physician: Dr. Marius Ditch ? ?PATIENT REVIEW QUESTIONS: The patient responded to the following health history questions as indicated:   ? ?1. Are you having any GI issues? no ?2. Do you have a personal history of Polyps? no ?3. Do you have a family history of Colon Cancer or Polyps? no ?4. Diabetes Mellitus? no ?5. Joint replacements in the past 12 months?no ?6. Major health problems in the past 3 months?no ?7. Any artificial heart valves, MVP, or defibrillator?no ?   ?MEDICATIONS & ALLERGIES:    ?Patient reports the following regarding taking any anticoagulation/antiplatelet therapy:   ?Plavix, Coumadin, Eliquis, Xarelto, Lovenox, Pradaxa, Brilinta, or Effient? no ?Aspirin? no ? ?Patient confirms/reports the following medications:  ?No current outpatient medications on file.  ? ?No current facility-administered medications for this visit.  ? ? ?Patient confirms/reports the following allergies:  ?No Known Allergies ? ?No orders of the defined types were placed in this encounter. ? ? ?AUTHORIZATION INFORMATION ?Primary Insurance: ?1D#: ?Group #: ? ?Secondary Insurance: ?1D#: ?Group #: ? ?SCHEDULE INFORMATION: ?Date: 02/24/2022 ?Time: ?Location:[facility] ? ?

## 2022-02-23 ENCOUNTER — Encounter: Payer: Self-pay | Admitting: Gastroenterology

## 2022-02-24 ENCOUNTER — Ambulatory Visit
Admission: RE | Admit: 2022-02-24 | Discharge: 2022-02-24 | Disposition: A | Discharge status: Home / Self Care | Payer: BC Managed Care – PPO | Attending: Gastroenterology | Admitting: Gastroenterology

## 2022-02-24 ENCOUNTER — Other Ambulatory Visit: Payer: Self-pay

## 2022-02-24 ENCOUNTER — Encounter: Payer: Self-pay | Admitting: Gastroenterology

## 2022-02-24 ENCOUNTER — Ambulatory Visit: Payer: BC Managed Care – PPO | Admitting: Registered Nurse

## 2022-02-24 ENCOUNTER — Encounter
Admission: RE | Disposition: A | Discharge status: Home / Self Care | Payer: Self-pay | Source: Home / Self Care | Attending: Gastroenterology

## 2022-02-24 DIAGNOSIS — Z87891 Personal history of nicotine dependence: Secondary | ICD-10-CM | POA: Diagnosis not present

## 2022-02-24 DIAGNOSIS — Z1211 Encounter for screening for malignant neoplasm of colon: Secondary | ICD-10-CM

## 2022-02-24 HISTORY — PX: COLONOSCOPY WITH PROPOFOL: SHX5780

## 2022-02-24 SURGERY — COLONOSCOPY WITH PROPOFOL
Anesthesia: General

## 2022-02-24 MED ORDER — PROPOFOL 500 MG/50ML IV EMUL
INTRAVENOUS | Status: DC | PRN
Start: 2022-02-24 — End: 2022-02-24
  Administered 2022-02-24: 150 ug/kg/min via INTRAVENOUS

## 2022-02-24 MED ORDER — LIDOCAINE HCL (CARDIAC) PF 100 MG/5ML IV SOSY
PREFILLED_SYRINGE | INTRAVENOUS | Status: DC | PRN
  Administered 2022-02-24: 40 mg via INTRAVENOUS

## 2022-02-24 MED ORDER — SODIUM CHLORIDE 0.9 % IV SOLN
INTRAVENOUS | Status: DC
  Administered 2022-02-24: 20 mL/h via INTRAVENOUS

## 2022-02-24 MED ORDER — PROPOFOL 10 MG/ML IV BOLUS
INTRAVENOUS | Status: DC | PRN
  Administered 2022-02-24: 10 mg via INTRAVENOUS
  Administered 2022-02-24: 100 mg via INTRAVENOUS

## 2022-02-24 MED ORDER — SUTAB 1479-225-188 MG PO TABS: 12.0000 | ORAL_TABLET | Freq: Once | ORAL | 0 refills | Status: AC

## 2022-02-24 MED ORDER — DEXMEDETOMIDINE (PRECEDEX) IN NS 20 MCG/5ML (4 MCG/ML) IV SYRINGE
PREFILLED_SYRINGE | INTRAVENOUS | Status: DC | PRN
  Administered 2022-02-24: 8 ug via INTRAVENOUS

## 2022-02-24 MED ORDER — PROPOFOL 500 MG/50ML IV EMUL
INTRAVENOUS | Status: AC
  Filled 2022-02-24: qty 50

## 2022-02-24 NOTE — Anesthesia Postprocedure Evaluation (Signed)
Anesthesia Post Note ? ?Patient: Jakyri Brunkhorst ? ?Procedure(s) Performed: COLONOSCOPY WITH PROPOFOL ? ?Patient location during evaluation: Endoscopy ?Anesthesia Type: General ?Level of consciousness: awake and alert ?Pain management: pain level controlled ?Vital Signs Assessment: post-procedure vital signs reviewed and stable ?Respiratory status: spontaneous breathing, nonlabored ventilation, respiratory function stable and patient connected to nasal cannula oxygen ?Cardiovascular status: blood pressure returned to baseline and stable ?Postop Assessment: no apparent nausea or vomiting ?Anesthetic complications: no ? ? ?No notable events documented. ? ? ?Last Vitals:  ?Vitals:  ? 02/24/22 0958 02/24/22 1027  ?BP: (!) 87/53 110/80  ?Pulse: 72 67  ?Resp: 17 16  ?Temp:    ?SpO2: 98% 99%  ?  ?Last Pain:  ?Vitals:  ? 02/24/22 1027  ?TempSrc:   ?PainSc: 0-No pain  ? ? ?  ?  ?  ?  ?  ?  ? ?Arita Miss ? ? ? ? ?

## 2022-02-24 NOTE — Transfer of Care (Signed)
Immediate Anesthesia Transfer of Care Note ? ?Patient: David Norton ? ?Procedure(s) Performed: Procedure(s): ?COLONOSCOPY WITH PROPOFOL (N/A) ? ?Patient Location: PACU and Endoscopy Unit ? ?Anesthesia Type:General ? ?Level of Consciousness: sedated ? ?Airway & Oxygen Therapy: Patient Spontanous Breathing and Patient connected to nasal cannula oxygen ? ?Post-op Assessment: Report given to RN and Post -op Vital signs reviewed and stable ? ?Post vital signs: Reviewed and stable ? ?Last Vitals:  ?Vitals:  ? 02/24/22 0913 02/24/22 0958  ?BP: 116/80 (!) 87/53  ?Pulse: 65 72  ?Resp: 20 17  ?Temp: (!) 35.9 ?C   ?SpO2: 98% 98%  ? ? ?Complications: No apparent anesthesia complications ?

## 2022-02-24 NOTE — Anesthesia Procedure Notes (Signed)
Date/Time: 02/24/2022 9:36 AM ?Performed by: Doreen Salvage, CRNA ?Pre-anesthesia Checklist: Patient identified, Emergency Drugs available, Suction available and Patient being monitored ?Patient Re-evaluated:Patient Re-evaluated prior to induction ?Oxygen Delivery Method: Nasal cannula ?Induction Type: IV induction ?Dental Injury: Teeth and Oropharynx as per pre-operative assessment  ?Comments: Nasal cannula with etCO2 monitoring ? ? ? ? ?

## 2022-02-24 NOTE — Progress Notes (Signed)
Patient was not clean out today for colonoscopy so repeating colonoscopy tomorrow. Sent Sutab to the pharmacy  ?

## 2022-02-24 NOTE — H&P (Signed)
?  Cephas Darby, MD ?[address]  ?[address]  ?[place], [place] [address]  ?Main: [phone]  ?Fax: [phone] ?Pager: [phone] ? ?Primary Care Physician:  Owens Loffler, MD ?Primary Gastroenterologist:  Dr. Cephas Darby ? ?Pre-Procedure History & Physical: ?HPI:  David Norton is a 51 y.o. male is here for an colonoscopy. ?  ?History reviewed. No pertinent past medical history. ? ?Past Surgical History:  ?Procedure Laterality Date  ? FRACTURE SURGERY    ? HERNIA REPAIR    ? SHOULDER ARTHROSCOPY W/ LABRAL REPAIR Left 08/2016  ? Dr. Tamera Punt  ? ? ?Prior to Admission medications   ?Medication Sig Start Date End Date Taking? Authorizing Provider  ?meloxicam (MOBIC) 15 MG tablet Take 15 mg by mouth daily.   Yes [provider]  ? ? ?Allergies as of 01/27/2022  ? (No Known Allergies)  ? ? ?Family History  ?Problem Relation Age of Onset  ? Thyroid disease Mother   ? Lung cancer Maternal Grandmother   ? ? ?Social History  ? ?Socioeconomic History  ? Marital status: Married  ?  Spouse name: Not on file  ? Number of children: Not on file  ? Years of education: Not on file  ? Highest education level: Not on file  ?Occupational History  ? Not on file  ?Tobacco Use  ? Smoking status: Former  ? Smokeless tobacco: Never  ?Substance and Sexual Activity  ? Alcohol use: Yes  ?  Alcohol/week: 0.0 standard drinks  ?  Comment: rarely  ? Drug use: No  ? Sexual activity: Yes  ?  Partners: Female  ?Other Topics Concern  ? Not on file  ?Social History Narrative  ? 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 Hot water heater business  ? ?Social Determinants of Health  ? ?Financial Resource Strain: Not on file  ?Food Insecurity: Not on file  ?Transportation Needs: Not on file  ?Physical Activity: Not on file  ?Stress: Not on file  ?Social Connections: Not on file  ?Intimate Partner Violence: Not on file  ? ? ?Review of Systems: ?See HPI, otherwise negative ROS ? ?Physical Exam: ?BP 116/80   Pulse 65   Temp (!)  96.7 ?F (35.9 ?C) (Temporal)   Resp 20   Ht '5\' 7"'$  (1.702 m)   Wt 81.6 kg   SpO2 98%   BMI 28.19 kg/m?  ?General:   Alert,  pleasant and cooperative in NAD ?Head:  Normocephalic and atraumatic. ?Neck:  Supple; no masses or thyromegaly. ?Lungs:  Clear throughout to auscultation.    ?Heart:  Regular rate and rhythm. ?Abdomen:  Soft, nontender and nondistended. Normal bowel sounds, without guarding, and without rebound.   ?Neurologic:  Alert and  oriented x4;  grossly normal neurologically. ? ?Impression/Plan: ?David Norton is here for an colonoscopy to be performed for colon cancer screening ? ?Risks, benefits, limitations, and alternatives regarding  colonoscopy have been reviewed with the patient.  Questions have been answered.  All parties agreeable. ? ? ?Sherri Sear, MD  02/24/2022, 9:33 AM ?

## 2022-02-24 NOTE — Op Note (Signed)
[facility] ?Gastroenterology ?Patient Name: David Norton ?Procedure Date: 02/24/2022 9:27 AM ?MRN: [id number] ?Account #: [id number] ?Date of Birth: 1971/08/20 ?Admit Type: Outpatient ?Age: 51 ?Room: [facility] ENDO ROOM 4 ?Gender: Male ?Note Status: Finalized ?Instrument Name: Colonoscope [id number] ?Procedure:             Colonoscopy ?Indications:           Screening for colorectal malignant neoplasm, This is  ?                       the patient's first colonoscopy ?Providers:             Lin Landsman MD, MD ?Referring MD:          Maud Deed. Copland MD, MD (Referring MD) ?Medicines:             General Anesthesia ?Complications:         No immediate complications. Estimated blood loss: None. ?Procedure:             Pre-Anesthesia Assessment: ?                       - Prior to the procedure, a History and Physical was  ?                       performed, and patient medications and allergies were  ?                       reviewed. The patient is competent. The risks and  ?                       benefits of the procedure and the sedation options and  ?                       risks were discussed with the patient. All questions  ?                       were answered and informed consent was obtained.  ?                       Patient identification and proposed procedure were  ?                       verified by the physician, the nurse, the  ?                       anesthesiologist, the anesthetist and the technician  ?                       in the pre-procedure area in the procedure room in the  ?                       endoscopy suite. Mental Status Examination: alert and  ?                       oriented. Airway Examination: normal oropharyngeal  ?                       airway and neck mobility. Respiratory Examination:  ?  clear to auscultation. CV Examination: normal.  ?                       Prophylactic Antibiotics: The patient does not require  ?                        prophylactic antibiotics. Prior Anticoagulants: The  ?                       patient has taken no previous anticoagulant or  ?                       antiplatelet agents. ASA Grade Assessment: I - A  ?                       normal, healthy patient. After reviewing the risks and  ?                       benefits, the patient was deemed in satisfactory  ?                       condition to undergo the procedure. The anesthesia  ?                       plan was to use general anesthesia. Immediately prior  ?                       to administration of medications, the patient was  ?                       re-assessed for adequacy to receive sedatives. The  ?                       heart rate, respiratory rate, oxygen saturations,  ?                       blood pressure, adequacy of pulmonary ventilation, and  ?                       response to care were monitored throughout the  ?                       procedure. The physical status of the patient was  ?                       re-assessed after the procedure. ?                       After obtaining informed consent, the colonoscope was  ?                       passed under direct vision. Throughout the procedure,  ?                       the patient's blood pressure, pulse, and oxygen  ?                       saturations were monitored continuously. The  ?  Colonoscope was introduced through the anus and  ?                       advanced to the the cecum, identified by appendiceal  ?                       orifice and ileocecal valve. The colonoscopy was  ?                       unusually difficult due to inadequate bowel prep. The  ?                       patient tolerated the procedure well. The quality of  ?                       the bowel preparation was poor. ?Findings: ?     The perianal and digital rectal examinations were normal. Pertinent  ?     negatives include normal sphincter tone and no palpable rectal lesions. ?     Copious quantities of  semi-liquid stool was found in the entire colon,  ?     precluding visualization. ?     The retroflexed view of the distal rectum and anal verge was normal and  ?     showed no anal or rectal abnormalities. ?Impression:            - Preparation of the colon was poor. ?                       - Stool in the entire examined colon. ?                       - The distal rectum and anal verge are normal on  ?                       retroflexion view. ?                       - No specimens collected. ?Recommendation:        - Discharge patient to home (with escort). ?                       - Resume previous diet today. ?                       - Continue present medications. ?                       - Repeat colonoscopy tomorrow or within 80month with  ?                       repeat prep because the bowel preparation was  ?                       suboptimal. ?Procedure Code(s):     --- Professional --- ?                       GZ6109 Colorectal cancer screening; colonoscopy on  ?  individual not meeting criteria for high risk ?Diagnosis Code(s):     --- Professional --- ?                       Z12.11, Encounter for screening for malignant neoplasm  ?                       of colon ?CPT copyright 2019 American [employer] Association. All rights reserved. ?The codes documented in this report are preliminary and upon coder review may  ?be revised to meet current compliance requirements. ?Dr. Ulyess Mort ?Anetha Slagel Raeanne Gathers MD, MD ?02/24/2022 9:54:45 AM ?This report has been signed electronically. ?Number of Addenda: 0 ?Note Initiated On: 02/24/2022 9:27 AM ?Scope Withdrawal Time: 0 hours 7 minutes 12 seconds  ?Total Procedure Duration: 0 hours 13 minutes 54 seconds  ?Estimated Blood Loss:  Estimated blood loss: none. ?     [facility] ?

## 2022-02-24 NOTE — Anesthesia Preprocedure Evaluation (Signed)
Anesthesia Evaluation  ?Patient identified by MRN, date of birth, ID band ?Patient awake ? ? ? ?Reviewed: ?Allergy & Precautions, NPO status , Patient's Chart, lab work & pertinent test results ? ?History of Anesthesia Complications ?Negative for: history of anesthetic complications ? ?Airway ?Mallampati: III ? ?TM Distance: >3 FB ?Neck ROM: Full ? ? ? Dental ?no notable dental hx. ?(+) Teeth Intact ?  ?Pulmonary ?neg pulmonary ROS, neg sleep apnea, neg COPD, Patient abstained from smoking.Not current smoker, former smoker,  ?  ?Pulmonary exam normal ?breath sounds clear to auscultation ? ? ? ? ? ? Cardiovascular ?Exercise Tolerance: Good ?METS(-) hypertension(-) CAD and (-) Past MI negative cardio ROS ? ?(-) dysrhythmias  ?Rhythm:Regular Rate:Normal ?- Systolic murmurs ? ?  ?Neuro/Psych ?negative neurological ROS ? negative psych ROS  ? GI/Hepatic ?neg GERD  ,(+)  ?  ? (-) substance abuse ? ,   ?Endo/Other  ?neg diabetes ? Renal/GU ?negative Renal ROS  ? ?  ?Musculoskeletal ? ? Abdominal ?  ?Peds ? Hematology ?  ?Anesthesia Other Findings ?History reviewed. No pertinent past medical history. ? Reproductive/Obstetrics ? ?  ? ? ? ? ? ? ? ? ? ? ? ? ? ?  ?  ? ? ? ? ? ? ? ? ?Anesthesia Physical ?Anesthesia Plan ? ?ASA: 1 ? ?Anesthesia Plan: General  ? ?Post-op Pain Management: Minimal or no pain anticipated  ? ?Induction: Intravenous ? ?PONV Risk Score and Plan: 2 and Propofol infusion, TIVA and Ondansetron ? ?Airway Management Planned: Nasal Cannula ? ?Additional Equipment: None ? ?Intra-op Plan:  ? ?Post-operative Plan:  ? ?Informed Consent: I have reviewed the patients History and Physical, chart, labs and discussed the procedure including the risks, benefits and alternatives for the proposed anesthesia with the patient or authorized representative who has indicated his/her understanding and acceptance.  ? ? ? ?Dental advisory given ? ?Plan Discussed with: CRNA and  Surgeon ? ?Anesthesia Plan Comments: (Discussed risks of anesthesia with patient, including possibility of difficulty with spontaneous ventilation under anesthesia necessitating airway intervention, PONV, and rare risks such as cardiac or respiratory or neurological events, and allergic reactions. Discussed the role of CRNA in patient's perioperative care. Patient understands.)  ? ? ? ? ? ? ?Anesthesia Quick Evaluation ? ?

## 2022-02-25 ENCOUNTER — Ambulatory Visit
Admission: RE | Admit: 2022-02-25 | Discharge: 2022-02-25 | Disposition: A | Discharge status: Home / Self Care | Payer: BC Managed Care – PPO | Attending: Gastroenterology | Admitting: Gastroenterology

## 2022-02-25 ENCOUNTER — Encounter: Payer: Self-pay | Admitting: Gastroenterology

## 2022-02-25 ENCOUNTER — Encounter
Admission: RE | Disposition: A | Discharge status: Home / Self Care | Payer: Self-pay | Source: Home / Self Care | Attending: Gastroenterology

## 2022-02-25 ENCOUNTER — Ambulatory Visit: Payer: BC Managed Care – PPO | Admitting: Anesthesiology

## 2022-02-25 ENCOUNTER — Other Ambulatory Visit: Payer: Self-pay

## 2022-02-25 DIAGNOSIS — Z1211 Encounter for screening for malignant neoplasm of colon: Secondary | ICD-10-CM

## 2022-02-25 DIAGNOSIS — K644 Residual hemorrhoidal skin tags: Secondary | ICD-10-CM | POA: Diagnosis not present

## 2022-02-25 DIAGNOSIS — K635 Polyp of colon: Secondary | ICD-10-CM | POA: Diagnosis not present

## 2022-02-25 DIAGNOSIS — D120 Benign neoplasm of cecum: Secondary | ICD-10-CM | POA: Insufficient documentation

## 2022-02-25 DIAGNOSIS — Z87891 Personal history of nicotine dependence: Secondary | ICD-10-CM | POA: Diagnosis not present

## 2022-02-25 HISTORY — PX: POLYPECTOMY: SHX5525

## 2022-02-25 HISTORY — PX: COLONOSCOPY WITH PROPOFOL: SHX5780

## 2022-02-25 HISTORY — DX: Other specified health status: Z78.9

## 2022-02-25 SURGERY — COLONOSCOPY WITH PROPOFOL
Anesthesia: General | Site: Rectum

## 2022-02-25 MED ORDER — ACETAMINOPHEN 325 MG PO TABS: 325.0000 mg | ORAL_TABLET | Freq: Once | ORAL | Status: DC

## 2022-02-25 MED ORDER — PROPOFOL 10 MG/ML IV BOLUS
INTRAVENOUS | Status: DC | PRN
  Administered 2022-02-25: 20 mg via INTRAVENOUS
  Administered 2022-02-25 (×3): 30 mg via INTRAVENOUS
  Administered 2022-02-25: 20 mg via INTRAVENOUS
  Administered 2022-02-25: 30 mg via INTRAVENOUS
  Administered 2022-02-25: 40 mg via INTRAVENOUS
  Administered 2022-02-25 (×3): 30 mg via INTRAVENOUS
  Administered 2022-02-25: 20 mg via INTRAVENOUS
  Administered 2022-02-25: 100 mg via INTRAVENOUS

## 2022-02-25 MED ORDER — ACETAMINOPHEN 160 MG/5ML PO SOLN: 325.0000 mg | Freq: Once | ORAL | Status: DC

## 2022-02-25 MED ORDER — LACTATED RINGERS IV SOLN: INTRAVENOUS | Status: DC

## 2022-02-25 MED ORDER — LIDOCAINE HCL (CARDIAC) PF 100 MG/5ML IV SOSY
PREFILLED_SYRINGE | INTRAVENOUS | Status: DC | PRN
  Administered 2022-02-25: 30 mg via INTRAVENOUS

## 2022-02-25 MED ORDER — STERILE WATER FOR IRRIGATION IR SOLN
Status: DC | PRN
  Administered 2022-02-25: 150 mL

## 2022-02-25 MED ORDER — SODIUM CHLORIDE 0.9 % IV SOLN: INTRAVENOUS | Status: DC

## 2022-02-25 MED ORDER — STERILE WATER FOR IRRIGATION IR SOLN
Status: DC | PRN
  Administered 2022-02-25: 180 mL

## 2022-02-25 SURGICAL SUPPLY — 26 items
CLIP HMST 235XBRD CATH ROT (MISCELLANEOUS) IMPLANT
CLIP RESOLUTION 360 11X235 (MISCELLANEOUS)
ELECT REM PT RETURN 9FT ADLT (ELECTROSURGICAL)
ELECTRODE REM PT RTRN 9FT ADLT (ELECTROSURGICAL) IMPLANT
FCP ESCP3.2XJMB 240X2.8X (MISCELLANEOUS)
FORCEPS BIOP RAD 4 LRG CAP 4 (CUTTING FORCEPS) IMPLANT
FORCEPS BIOP RJ4 240 W/NDL (MISCELLANEOUS)
FORCEPS ESCP3.2XJMB 240X2.8X (MISCELLANEOUS) IMPLANT
GOWN CVR UNV OPN BCK APRN NK (MISCELLANEOUS) ×4 IMPLANT
GOWN ISOL THUMB LOOP REG UNIV (MISCELLANEOUS) ×6
INJECTOR VARIJECT VIN23 (MISCELLANEOUS) IMPLANT
KIT DEFENDO VALVE AND CONN (KITS) IMPLANT
KIT PRC NS LF DISP ENDO (KITS) ×2 IMPLANT
KIT PROCEDURE OLYMPUS (KITS) ×3
MANIFOLD NEPTUNE II (INSTRUMENTS) ×3 IMPLANT
MARKER SPOT ENDO TATTOO 5ML (MISCELLANEOUS) IMPLANT
PROBE APC STR FIRE (PROBE) IMPLANT
RETRIEVER NET ROTH 2.5X230 LF (MISCELLANEOUS) IMPLANT
SNARE COLD EXACTO (MISCELLANEOUS) ×1 IMPLANT
SNARE SHORT THROW 13M SML OVAL (MISCELLANEOUS) IMPLANT
SNARE SHORT THROW 30M LRG OVAL (MISCELLANEOUS) IMPLANT
SNARE SNG USE RND 15MM (INSTRUMENTS) IMPLANT
SPOT EX ENDOSCOPIC TATTOO (MISCELLANEOUS)
TRAP ETRAP POLY (MISCELLANEOUS) IMPLANT
VARIJECT INJECTOR VIN23 (MISCELLANEOUS)
WATER STERILE IRR 250ML POUR (IV SOLUTION) ×3 IMPLANT

## 2022-02-25 NOTE — Anesthesia Postprocedure Evaluation (Signed)
Anesthesia Post Note ? ?Patient: David Norton ? ?Procedure(s) Performed: COLONOSCOPY WITH PROPOFOL (Rectum) ?POLYPECTOMY ? ? ?  ?Patient location during evaluation: PACU ?Anesthesia Type: General ?Level of consciousness: awake and alert and oriented ?Pain management: satisfactory to patient ?Vital Signs Assessment: post-procedure vital signs reviewed and stable ?Respiratory status: spontaneous breathing, nonlabored ventilation and respiratory function stable ?Cardiovascular status: blood pressure returned to baseline and stable ?Postop Assessment: Adequate PO intake and No signs of nausea or vomiting ?Anesthetic complications: no ? ? ?No notable events documented. ? ?Raliegh Ip ? ? ? ? ? ?

## 2022-02-25 NOTE — Transfer of Care (Signed)
Immediate Anesthesia Transfer of Care Note ? ?Patient: David Norton ? ?Procedure(s) Performed: COLONOSCOPY WITH PROPOFOL (Rectum) ?POLYPECTOMY ? ?Patient Location: PACU ? ?Anesthesia Type: General ? ?Level of Consciousness: awake, alert  and patient cooperative ? ?Airway and Oxygen Therapy: Patient Spontanous Breathing and Patient connected to supplemental oxygen ? ?Post-op Assessment: Post-op Vital signs reviewed, Patient's Cardiovascular Status Stable, Respiratory Function Stable, Patent Airway and No signs of Nausea or vomiting ? ?Post-op Vital Signs: Reviewed and stable ? ?Complications: No notable events documented. ? ?

## 2022-02-25 NOTE — H&P (Signed)
?  Cephas Darby, MD ?[address]  ?[address]  ?[place], [place] [address]  ?Main: [phone]  ?Fax: [phone] ?Pager: [phone] ? ?Primary Care Physician:  Owens Loffler, MD ?Primary Gastroenterologist:  Dr. Cephas Darby ? ?Pre-Procedure History & Physical: ?HPI:  David Norton is a 51 y.o. male is here for an colonoscopy. ?  ?Past Medical History:  ?Diagnosis Date  ? Medical history non-contributory   ? ? ?Past Surgical History:  ?Procedure Laterality Date  ? FRACTURE SURGERY    ? HERNIA REPAIR    ? SHOULDER ARTHROSCOPY W/ LABRAL REPAIR Left 08/2016  ? Dr. Tamera Punt  ? ? ?Prior to Admission medications   ?Not on File  ? ? ?Allergies as of 02/24/2022  ? (No Known Allergies)  ? ? ?Family History  ?Problem Relation Age of Onset  ? Thyroid disease Mother   ? Lung cancer Maternal Grandmother   ? ? ?Social History  ? ?Socioeconomic History  ? Marital status: Married  ?  Spouse name: Not on file  ? Number of children: Not on file  ? Years of education: Not on file  ? Highest education level: Not on file  ?Occupational History  ? Not on file  ?Tobacco Use  ? Smoking status: Former  ?  Types: Cigarettes  ?  Quit date: 22  ?  Years since quitting: 38.3  ? Smokeless tobacco: Never  ?Vaping Use  ? Vaping Use: Never used  ?Substance and Sexual Activity  ? Alcohol use: Yes  ?  Alcohol/week: 0.0 standard drinks  ?  Comment: rarely  ? Drug use: No  ? Sexual activity: Yes  ?  Partners: Female  ?Other Topics Concern  ? Not on file  ?Social History Narrative  ? 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 Hot water heater business  ? ?Social Determinants of Health  ? ?Financial Resource Strain: Not on file  ?Food Insecurity: Not on file  ?Transportation Needs: Not on file  ?Physical Activity: Not on file  ?Stress: Not on file  ?Social Connections: Not on file  ?Intimate Partner Violence: Not on file  ? ? ?Review of Systems: ?See HPI, otherwise negative ROS ? ?Physical Exam: ?BP 118/74   Pulse 65   Temp 97.7 ?F  (36.5 ?C) (Temporal)   Ht '5\' 7"'$  (1.702 m)   Wt 81 kg   SpO2 98%   BMI 27.96 kg/m?  ?General:   Alert,  pleasant and cooperative in NAD ?Head:  Normocephalic and atraumatic. ?Neck:  Supple; no masses or thyromegaly. ?Lungs:  Clear throughout to auscultation.    ?Heart:  Regular rate and rhythm. ?Abdomen:  Soft, nontender and nondistended. Normal bowel sounds, without guarding, and without rebound.   ?Neurologic:  Alert and  oriented x4;  grossly normal neurologically. ? ?Impression/Plan: ?David Norton is here for an colonoscopy to be performed for colon cancer screening ? ?Risks, benefits, limitations, and alternatives regarding  colonoscopy have been reviewed with the patient.  Questions have been answered.  All parties agreeable. ? ? ?Sherri Sear, MD  02/25/2022, 7:46 AM ?

## 2022-02-25 NOTE — Anesthesia Procedure Notes (Signed)
Date/Time: 02/25/2022 7:57 AM ?Performed by: Cameron Ali, CRNA ?Pre-anesthesia Checklist: Patient identified, Emergency Drugs available, Suction available, Timeout performed and Patient being monitored ?Patient Re-evaluated:Patient Re-evaluated prior to induction ?Oxygen Delivery Method: Nasal cannula ?Placement Confirmation: positive ETCO2 ? ? ? ? ?

## 2022-02-25 NOTE — Anesthesia Preprocedure Evaluation (Signed)
Anesthesia Evaluation  ?Patient identified by MRN, date of birth, ID band ?Patient awake ? ? ? ?Reviewed: ?Allergy & Precautions, H&P , NPO status , Patient's Chart, lab work & pertinent test results ? ?Airway ?Mallampati: II ? ?TM Distance: >3 FB ?Neck ROM: full ? ? ? Dental ?no notable dental hx. ? ?  ?Pulmonary ?former smoker,  ?  ?Pulmonary exam normal ?breath sounds clear to auscultation ? ? ? ? ? ? Cardiovascular ?Normal cardiovascular exam ?Rhythm:regular Rate:Normal ? ? ?  ?Neuro/Psych ?  ? GI/Hepatic ?  ?Endo/Other  ? ? Renal/GU ?  ? ?  ?Musculoskeletal ? ? Abdominal ?  ?Peds ? Hematology ?  ?Anesthesia Other Findings ? ? Reproductive/Obstetrics ? ?  ? ? ? ? ? ? ? ? ? ? ? ? ? ?  ?  ? ? ? ? ? ? ? ? ?Anesthesia Physical ?Anesthesia Plan ? ?ASA: 1 ? ?Anesthesia Plan: General  ? ?Post-op Pain Management: Minimal or no pain anticipated  ? ?Induction: Intravenous ? ?PONV Risk Score and Plan: 2 and Treatment may vary due to age or medical condition, Propofol infusion and TIVA ? ?Airway Management Planned: Natural Airway ? ?Additional Equipment:  ? ?Intra-op Plan:  ? ?Post-operative Plan:  ? ?Informed Consent: I have reviewed the patients History and Physical, chart, labs and discussed the procedure including the risks, benefits and alternatives for the proposed anesthesia with the patient or authorized representative who has indicated his/her understanding and acceptance.  ? ? ? ?Dental Advisory Given ? ?Plan Discussed with: CRNA ? ?Anesthesia Plan Comments:   ? ? ? ? ? ? ?Anesthesia Quick Evaluation ? ?

## 2022-02-25 NOTE — Op Note (Addendum)
[facility] ?Gastroenterology ?Patient Name: David Norton ?Procedure Date: 02/25/2022 7:09 AM ?MRN: [id number] ?Account #: [id number] ?Date of Birth: [birth year] ?Admit Type: Outpatient ?Age: 51 ?Room: [facility] OR ROOM 01 ?Gender: Male ?Note Status: Finalized ?Instrument Name: [id number] ?Procedure:             Colonoscopy ?Indications:           Screening for colorectal malignant neoplasm, Screening  ?                       for colorectal malignant neoplasm, inadequate bowel  ?                       prep on last colonoscopy (more recent than 10 years  ?                       ago) ?Providers:             Lin Landsman MD, MD ?Referring MD:          Maud Deed. Copland MD, MD (Referring MD) ?Medicines:             General Anesthesia ?Complications:         No immediate complications. Estimated blood loss: None. ?Procedure:             Pre-Anesthesia Assessment: ?                       - Prior to the procedure, a History and Physical was  ?                       performed, and patient medications and allergies were  ?                       reviewed. The patient is competent. The risks and  ?                       benefits of the procedure and the sedation options and  ?                       risks were discussed with the patient. All questions  ?                       were answered and informed consent was obtained.  ?                       Patient identification and proposed procedure were  ?                       verified by the physician, the nurse, the  ?                       anesthesiologist, the anesthetist and the technician  ?                       in the pre-procedure area in the procedure room in the  ?                       endoscopy suite. Mental Status Examination: alert and  ?  oriented. Airway Examination: normal oropharyngeal  ?                       airway and neck mobility. Respiratory Examination:  ?                       clear to auscultation. CV Examination:  normal.  ?                       Prophylactic Antibiotics: The patient does not require  ?                       prophylactic antibiotics. Prior Anticoagulants: The  ?                       patient has taken no previous anticoagulant or  ?                       antiplatelet agents. ASA Grade Assessment: I - A  ?                       normal, healthy patient. After reviewing the risks and  ?                       benefits, the patient was deemed in satisfactory  ?                       condition to undergo the procedure. The anesthesia  ?                       plan was to use general anesthesia. Immediately prior  ?                       to administration of medications, the patient was  ?                       re-assessed for adequacy to receive sedatives. The  ?                       heart rate, respiratory rate, oxygen saturations,  ?                       blood pressure, adequacy of pulmonary ventilation, and  ?                       response to care were monitored throughout the  ?                       procedure. The physical status of the patient was  ?                       re-assessed after the procedure. ?                       After obtaining informed consent, the colonoscope was  ?                       passed under direct vision. Throughout the procedure,  ?  the patient's blood pressure, pulse, and oxygen  ?                       saturations were monitored continuously. The  ?                       Colonoscope was introduced through the anus and  ?                       advanced to the the cecum, identified by appendiceal  ?                       orifice and ileocecal valve. The colonoscopy was  ?                       performed without difficulty. The patient tolerated  ?                       the procedure well. The quality of the bowel  ?                       preparation was evaluated using the BBPS [facility] Bowel  ?                       Preparation Scale) with scores of: Right  Colon = 3,  ?                       Transverse Colon = 3 and Left Colon = 3 (entire mucosa  ?                       seen well with no residual staining, small fragments  ?                       of stool or opaque liquid). The total BBPS score  ?                       equals 9. ?Findings: ?     A 5 mm polyp was found in the cecum. The polyp was sessile. The polyp  ?     was removed with a cold snare. Resection and retrieval were complete.  ?     Estimated blood loss: none. ?     The retroflexed view of the distal rectum and anal verge was normal and  ?     showed no anal or rectal abnormalities. ?     Skin tags were found on perianal exam. ?     Non-bleeding external hemorrhoids were found during retroflexion. The  ?     hemorrhoids were medium-sized. ?Impression:            - One 5 mm polyp in the cecum, removed with a cold  ?                       snare. Resected and retrieved. ?                       - The distal rectum and anal verge are normal on  ?  retroflexion view. ?                       - Perianal skin tags found on perianal exam. ?                       - Non-bleeding external hemorrhoids. ?Recommendation:        - Discharge patient to home (with escort). ?                       - Resume previous diet today. ?                       - Continue present medications. ?                       - Await pathology results. ?                       - Repeat colonoscopy in 5 to 7 years for surveillance. ?Procedure Code(s):     --- Professional --- ?                       [phone], Colonoscopy, flexible; with removal of  ?                       tumor(s), polyp(s), or other lesion(s) by snare  ?                       technique ?Diagnosis Code(s):     --- Professional --- ?                       K63.5, Polyp of colon ?                       K64.4, Residual hemorrhoidal skin tags ?                       Z12.11, Encounter for screening for malignant neoplasm  ?                       of colon ?CPT  copyright 2019 American Medical Association. All rights reserved. ?The codes documented in this report are preliminary and upon coder review may  ?be revised to meet current compliance requirements. ?Dr. Ulyess Mort ?Dionna Wiedemann Raeanne Gathers MD, MD ?02/25/2022 8:21:55 AM ?This report has been signed electronically. ?Number of Addenda: 0 ?Note Initiated On: 02/25/2022 7:09 AM ?Scope Withdrawal Time: 0 hours 14 minutes 2 seconds  ?Total Procedure Duration: 0 hours 18 minutes 28 seconds  ?Estimated Blood Loss:  Estimated blood loss: none. ?     [facility] ?

## 2022-02-26 ENCOUNTER — Encounter: Payer: Self-pay | Admitting: Gastroenterology

## 2022-02-26 LAB — SURGICAL PATHOLOGY

## 2022-10-15 ENCOUNTER — Telehealth: Payer: Self-pay | Admitting: Family Medicine

## 2022-10-15 NOTE — Telephone Encounter (Signed)
Spoke with Juliann Pulse and advised last Shingrix was 01/25/2022 and that he is due for a 2nd dose.  Nurse visit scheduled for 10/19/22 at 3:45 pm.

## 2022-10-15 NOTE — Telephone Encounter (Signed)
Patient wife called and asked patient wanted to know when the last time he had a shingle shot. Call back number [phone].

## 2022-10-19 ENCOUNTER — Ambulatory Visit (INDEPENDENT_AMBULATORY_CARE_PROVIDER_SITE_OTHER): Payer: BC Managed Care – PPO

## 2022-10-19 DIAGNOSIS — Z23 Encounter for immunization: Secondary | ICD-10-CM

## 2022-10-19 NOTE — Progress Notes (Signed)
Per orders of Dr. Eliezer Lofts, injection of Shingrix given by [facility] in left deltoid. Patient tolerated injection well.

## 2023-01-02 ENCOUNTER — Encounter: Payer: Self-pay | Admitting: Emergency Medicine

## 2023-01-02 ENCOUNTER — Ambulatory Visit
Admission: EM | Admit: 2023-01-02 | Discharge: 2023-01-02 | Disposition: A | Discharge status: Home / Self Care | Payer: BC Managed Care – PPO | Attending: Urgent Care | Admitting: Urgent Care

## 2023-01-02 DIAGNOSIS — N3001 Acute cystitis with hematuria: Secondary | ICD-10-CM | POA: Diagnosis not present

## 2023-01-02 LAB — POCT URINALYSIS DIP (MANUAL ENTRY)
Bilirubin, UA: NEGATIVE
Glucose, UA: NEGATIVE mg/dL
Ketones, POC UA: NEGATIVE mg/dL
Nitrite, UA: NEGATIVE
Protein Ur, POC: NEGATIVE mg/dL
Spec Grav, UA: 1.015 (ref 1.010–1.025)
Urobilinogen, UA: 0.2 E.U./dL
pH, UA: 6 (ref 5.0–8.0)

## 2023-01-02 MED ORDER — CIPROFLOXACIN HCL 500 MG PO TABS: 500.0000 mg | ORAL_TABLET | Freq: Two times a day (BID) | ORAL | 0 refills | Status: AC

## 2023-01-02 NOTE — Discharge Instructions (Addendum)
Follow up here or with your primary care provider if your symptoms are worsening or not improving with treatment.          

## 2023-01-02 NOTE — ED Triage Notes (Addendum)
Pt c/o headache, dizziness and urinary frequency since yesterday.

## 2023-01-02 NOTE — ED Provider Notes (Signed)
David Norton    CSN: [id number] Arrival date & time: 01/02/23  0944      History   Chief Complaint Chief Complaint  Patient presents with   Urinary Frequency    Low urinary flow or low volume - Entered by patient   Headache   Dizziness    HPI David Norton is a 52 y.o. male.    Urinary Frequency Associated symptoms include headaches.  Headache Associated symptoms: dizziness   Dizziness Associated symptoms: headaches     Presents with symptoms of headache, dizziness, urinary frequency and urgency with little output starting yesterday.  Up multiple times during the night.  Also complains of muscle aches, particularly in his lower thoracic.  He describes a "band" of pain across his lower thoracic which he initially described as his "back".  Denies fever, denies chills.  Past Medical History:  Diagnosis Date   Medical history non-contributory     Patient Active Problem List   Diagnosis Date Noted   Polyp of cecum    Screening for colon cancer    Acquired hallux limitus of both feet 07/12/2019   Chronic arthropathy 07/12/2019   Plantar fasciitis, bilateral 07/12/2019    Past Surgical History:  Procedure Laterality Date   COLONOSCOPY WITH PROPOFOL N/A 02/24/2022   Procedure: COLONOSCOPY WITH PROPOFOL;  Surgeon: Lin Landsman, MD;  Location: [facility] ENDOSCOPY;  Service: Gastroenterology;  Laterality: N/A;   COLONOSCOPY WITH PROPOFOL N/A 02/25/2022   Procedure: COLONOSCOPY WITH PROPOFOL;  Surgeon: Lin Landsman, MD;  Location: [place];  Service: Endoscopy;  Laterality: N/A;   FRACTURE SURGERY     HERNIA REPAIR     POLYPECTOMY  02/25/2022   Procedure: POLYPECTOMY;  Surgeon: Lin Landsman, MD;  Location: [place];  Service: Endoscopy;;   SHOULDER ARTHROSCOPY W/ LABRAL REPAIR Left 08/2016   Dr. Tamera Punt       Home Medications    Prior to Admission medications   Not on File    Family History Family History   Problem Relation Age of Onset   Thyroid disease Mother    Lung cancer Maternal Grandmother     Social History Social History   Tobacco Use   Smoking status: Former    Types: Cigarettes    Quit date: 1985    Years since quitting: 39.1   Smokeless tobacco: Never  Vaping Use   Vaping Use: Never used  Substance Use Topics   Alcohol use: Yes    Alcohol/week: 0.0 standard drinks of alcohol    Comment: rarely   Drug use: No     Allergies   Patient has no known allergies.   Review of Systems Review of Systems  Genitourinary:  Positive for frequency.  Neurological:  Positive for dizziness and headaches.     Physical Exam Triage Vital Signs ED Triage Vitals  Enc Vitals Group     BP 01/02/23 1109 (!) 107/57     Pulse Rate 01/02/23 1109 86     Resp 01/02/23 1109 18     Temp 01/02/23 1109 99 F (37.2 C)     Temp Source 01/02/23 1109 Oral     SpO2 01/02/23 1109 96 %     Weight --      Height --      Head Circumference --      Peak Flow --      Pain Score 01/02/23 1111 2     Pain Loc --      Pain  Edu? --      Excl. in [place]? --    No data found.  Updated Vital Signs BP (!) 107/57 (BP Location: Left Arm)   Pulse 86   Temp 99 F (37.2 C) (Oral)   Resp 18   SpO2 96%   Visual Acuity Right Eye Distance:   Left Eye Distance:   Bilateral Distance:    Right Eye Near:   Left Eye Near:    Bilateral Near:     Physical Exam Vitals and nursing note reviewed.  Constitutional:      Appearance: He is well-developed.  Abdominal:     General: Abdomen is flat.     Tenderness: There is no right CVA tenderness or left CVA tenderness.  Skin:    General: Skin is warm and dry.  Neurological:     Mental Status: He is alert and oriented to person, place, and time.  Psychiatric:        Mood and Affect: Mood normal.        Behavior: Behavior normal.      UC Treatments / Results  Labs (all labs ordered are listed, but only abnormal results are displayed) Labs  Reviewed  POCT URINALYSIS DIP (MANUAL ENTRY) - Abnormal; Notable for the following components:      Result Value   Blood, UA small (*)    Leukocytes, UA Small (1+) (*)    All other components within normal limits    EKG   Radiology No results found.  Procedures Procedures (including critical care time)  Medications Ordered in UC Medications - No data to display  Initial Impression / Assessment and Plan / UC Course  I have reviewed the triage vital signs and the nursing notes.  Pertinent labs & imaging results that were available during my care of the patient were reviewed by me and considered in my medical decision making (see chart for details).   UA result is suggestive for UTI with 1+ leukocytes. 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  Muscle aching possibly related to training regimen though he does not endorse any exertion/training recently.  Concern for complicated UTI versus prostatitis.  Will prescribe Cipro with extended dosing and sending sample of his urine to lab to confirm susceptibility.   Final Clinical Impressions(s) / UC Diagnoses   Final diagnoses:  None   Discharge Instructions   None    ED Prescriptions   None    PDMP not reviewed this encounter.   Rose Phi, [place] 01/02/23 1125

## 2023-01-05 ENCOUNTER — Telehealth: Payer: Self-pay | Admitting: Family Medicine

## 2023-01-05 ENCOUNTER — Telehealth (HOSPITAL_COMMUNITY): Payer: Self-pay | Admitting: Emergency Medicine

## 2023-01-05 LAB — URINE CULTURE: Culture: 60000 — AB

## 2023-01-05 MED ORDER — SULFAMETHOXAZOLE-TRIMETHOPRIM 800-160 MG PO TABS
1.0000 | ORAL_TABLET | Freq: Two times a day (BID) | ORAL | 0 refills | Status: DC
Start: 2023-01-05 — End: 2023-01-27

## 2023-01-05 NOTE — Telephone Encounter (Signed)
Pt's wife, Juliann Pulse, called stating on Sunday, 3/3, the pt visited the UC for blood in urine, dizziness & frequent urination (urinating very little each time). Juliann Pulse stated the UC stated it was a possible uti or kidney infection. Juliann Pulse stated the UC prescribed him ciprofloxacin (CIPRO) 500 MG tablet & told pt to wait for results. Juliann Pulse stated the results on MyChart mentioned Coli. Juliann Pulse is asking for advice from Dr. Lorelei Pont & are asking what should the next steps be for pt?

## 2023-01-05 NOTE — Telephone Encounter (Signed)
Opened in error

## 2023-01-05 NOTE — Telephone Encounter (Signed)
Please call  His E. Coli UTI is resistant to Cipro, so I sent him in some Bactrim, which should cover the infection.

## 2023-01-05 NOTE — Telephone Encounter (Signed)
Juliann Pulse (wife) notified as instructed by telephone.  States understanding.  Very appreciative of the return call.

## 2023-01-26 ENCOUNTER — Encounter: Payer: BC Managed Care – PPO | Admitting: Family Medicine

## 2023-01-26 ENCOUNTER — Ambulatory Visit: Payer: BC Managed Care – PPO | Admitting: Internal Medicine

## 2023-01-27 ENCOUNTER — Encounter: Payer: Self-pay | Admitting: Nurse Practitioner

## 2023-01-27 ENCOUNTER — Ambulatory Visit: Payer: BC Managed Care – PPO | Admitting: Nurse Practitioner

## 2023-01-27 VITALS — BP 100/64 | HR 80 | Temp 98.0°F | Resp 16 | Ht 67.0 in | Wt 178.2 lb

## 2023-01-27 DIAGNOSIS — N3001 Acute cystitis with hematuria: Secondary | ICD-10-CM | POA: Insufficient documentation

## 2023-01-27 DIAGNOSIS — R350 Frequency of micturition: Secondary | ICD-10-CM | POA: Diagnosis not present

## 2023-01-27 DIAGNOSIS — R309 Painful micturition, unspecified: Secondary | ICD-10-CM

## 2023-01-27 LAB — POC URINALSYSI DIPSTICK (AUTOMATED)
Bilirubin, UA: NEGATIVE
Glucose, UA: NEGATIVE
Ketones, UA: NEGATIVE
Nitrite, UA: NEGATIVE
Protein, UA: NEGATIVE
Spec Grav, UA: 1.015 (ref 1.010–1.025)
Urobilinogen, UA: 0.2 E.U./dL
pH, UA: 6 (ref 5.0–8.0)

## 2023-01-27 LAB — PSA: PSA: 1.55 ng/mL (ref 0.10–4.00)

## 2023-01-27 MED ORDER — SULFAMETHOXAZOLE-TRIMETHOPRIM 800-160 MG PO TABS: 1.0000 | ORAL_TABLET | Freq: Two times a day (BID) | ORAL | 0 refills | Status: DC

## 2023-01-27 NOTE — Assessment & Plan Note (Signed)
UA in office 

## 2023-01-27 NOTE — Patient Instructions (Signed)
Nice to see you today  I will be in touch with the labs and urine culture once I have reviewed them Follow up if no improvement

## 2023-01-27 NOTE — Assessment & Plan Note (Signed)
UA and PSA in office pending result

## 2023-01-27 NOTE — Progress Notes (Signed)
Acute Office Visit  Subjective:     Patient ID: David Norton, male    DOB: 04/18/1971, 52 y.o.   MRN: [id number]  Chief Complaint  Patient presents with   Dysuria    Since Sunday      Patient is in today for dysuria with a medical history that is non contributory   Symptoms started on Sunday states that he is having burning at the end of urination States that he does feel like it is incomplete emptying States that he does have nocturia x1. States that since Sunday has been normal amount of nocturia States that he has bad frequency through the day.  He did mention when he was seen last time he had high fevers of 103.8 He was prescribed cipro per his report. Culture came back resistant and was switch to bactrim and completed a weeks worth of the medication    Review of Systems  Constitutional:  Negative for chills and fever.  Respiratory:  Negative for shortness of breath.   Cardiovascular:  Negative for chest pain.  Gastrointestinal:  Negative for abdominal pain, constipation, diarrhea, nausea and vomiting.  Genitourinary:  Positive for dysuria.        Objective:    BP 100/64   Pulse 80   Temp 98 F (36.7 C)   Resp 16   Ht 5\' 7"  (1.702 m)   Wt 178 lb 4 oz (80.9 kg)   SpO2 97%   BMI 27.92 kg/m    Physical Exam Vitals and nursing note reviewed.  Constitutional:      Appearance: Normal appearance.  Cardiovascular:     Rate and Rhythm: Normal rate and regular rhythm.     Heart sounds: Normal heart sounds.  Pulmonary:     Effort: Pulmonary effort is normal.     Breath sounds: Normal breath sounds.  Abdominal:     General: Bowel sounds are normal. There is no distension.     Palpations: There is no mass.     Tenderness: There is no abdominal tenderness. There is no right CVA tenderness or left CVA tenderness.     Hernia: No hernia is present.  Neurological:     Mental Status: He is alert.     Results for orders placed or performed in visit on 01/27/23   POCT Urinalysis Dipstick (Automated)  Result Value Ref Range   Color, UA Yellow    Clarity, UA clear    Glucose, UA Negative Negative   Bilirubin, UA Negative    Ketones, UA Negative    Spec Grav, UA 1.015 1.010 - 1.025   Blood, UA +    pH, UA 6.0 5.0 - 8.0   Protein, UA Negative Negative   Urobilinogen, UA 0.2 0.2 or 1.0 E.U./dL   Nitrite, UA Negative    Leukocytes, UA Moderate (2+) (A) Negative        Assessment & Plan:   Problem List Items Addressed This Visit       Genitourinary   Acute cystitis with hematuria    UA indicative of infection with 2+ leuks and blood.  And concern for more of a prostatitis will check PSA and put patient back on Bactrim for 1 week pending labs and culture will likely extend for another week to check his residual from first time patient was evaluated.      Relevant Medications   sulfamethoxazole-trimethoprim (BACTRIM DS) 800-160 MG tablet     Other   Painful urination - Primary  UA in office.      Relevant Orders   POCT Urinalysis Dipstick (Automated) (Completed)   Urine Culture   PSA   Urinary frequency    UA and PSA in office pending result      Relevant Orders   PSA    Meds ordered this encounter  Medications   sulfamethoxazole-trimethoprim (BACTRIM DS) 800-160 MG tablet    Sig: Take 1 tablet by mouth 2 (two) times daily.    Dispense:  14 tablet    Refill:  0    Order Specific Question:   Supervising Provider    Answer:   TOWER, MARNE A [[id number]]    Return if symptoms worsen or fail to improve.  Romilda Garret, NP

## 2023-01-27 NOTE — Assessment & Plan Note (Signed)
UA indicative of infection with 2+ leuks and blood.  And concern for more of a prostatitis will check PSA and put patient back on Bactrim for 1 week pending labs and culture will likely extend for another week to check his residual from first time patient was evaluated.

## 2023-01-29 LAB — URINE CULTURE
MICRO NUMBER:: 14755086
SPECIMEN QUALITY:: ADEQUATE

## 2023-02-08 NOTE — Progress Notes (Unsigned)
David Chittum T. [name], MD, CAQ Sports Medicine [facility] [address] [place], [id number]  Phone: [phone]  FAX: [phone]  David Norton David Norton - 52 y.o. male  MRN [id number]  Date of Birth: 09/30/1971  Date: 02/09/2023  PCP: Hannah Beatopland, Jolee Critcher, MD  Referral: Hannah Beatopland, Edric Fetterman, MD  No chief complaint on file.  Patient Care Team: Hannah Beatopland, Scottie Stanish, MD as PCP - General (Family Medicine) Subjective:   David Norton David Norton is a 52 y.o. pleasant patient who presents with the following:  Preventative Health Maintenance Visit:  Health Maintenance Summary Reviewed and updated, unless pt declines services.  Tobacco History Reviewed. Alcohol: No concerns, no excessive use Exercise Habits: Some activity, rec at least 30 mins 5 times a week STD concerns: no risk or activity to increase risk Drug Use: None  He is a healthy gentleman, he presents today for routine healthcare maintenance.  COVID-19?  He has not wanted this in the past    Health Maintenance  Topic Date Due   COVID-19 Vaccine (1) Never done   INFLUENZA VACCINE  06/02/2023   COLONOSCOPY (Pts 45-611yrs Insurance coverage will need to be confirmed)  02/26/2027   DTaP/Tdap/Td (2 - Td or Tdap) 01/26/2032   Hepatitis C Screening  Completed   HIV Screening  Completed   Zoster Vaccines- Shingrix  Completed   HPV VACCINES  Aged Out   Immunization History  Administered Date(s) Administered   Tdap 01/25/2022   Zoster Recombinat (Shingrix) 01/25/2022, 10/19/2022   Patient Active Problem List   Diagnosis Date Noted   Polyp of cecum    Screening for colon cancer    Acquired hallux limitus of both feet 07/12/2019   Chronic arthropathy 07/12/2019   Plantar fasciitis, bilateral 07/12/2019    Past Medical History:  Diagnosis Date   Medical history non-contributory     Past Surgical History:  Procedure Laterality Date   COLONOSCOPY WITH PROPOFOL N/A 02/24/2022   Procedure: COLONOSCOPY  WITH PROPOFOL;  Surgeon: Toney ReilVanga, Rohini Reddy, MD;  Location: [facility] ENDOSCOPY;  Service: Gastroenterology;  Laterality: N/A;   COLONOSCOPY WITH PROPOFOL N/A 02/25/2022   Procedure: COLONOSCOPY WITH PROPOFOL;  Surgeon: Toney ReilVanga, Rohini Reddy, MD;  [facility];  Service: Endoscopy;  Laterality: N/A;   FRACTURE SURGERY     HERNIA REPAIR     POLYPECTOMY  02/25/2022   Procedure: POLYPECTOMY;  Surgeon: Toney ReilVanga, Rohini Reddy, MD;  [facility];  Service: Endoscopy;;   SHOULDER ARTHROSCOPY W/ LABRAL REPAIR Left 08/2016   Dr. Ave Filterhandler    Family History  Problem Relation Age of Onset   Thyroid disease Mother    Lung cancer Maternal Grandmother     Social History   Social History Narrative   Research officer, political partyBobybuilder and weightlifter   Married   Pension scheme managerHot water heater business    Past Medical History, Surgical History, Social History, Family History, Problem List, Medications, and Allergies have been reviewed and updated if relevant.  Review of Systems: Pertinent positives are listed above.  Otherwise, a full 14 point review of systems has been done in full and it is negative except where it is noted positive.  Objective:   There were no vitals taken for this visit. Ideal Body Weight:    Ideal Body Weight:   No results found.    01/27/2023    9:32 AM 01/25/2022    9:50 AM  Depression screen PHQ 2/9  Decreased Interest 0 0  Down, Depressed, Hopeless 0 0  PHQ - 2  Score 0 0  Altered sleeping 0   Tired, decreased energy 2   Change in appetite 0   Feeling bad or failure about yourself  0   Trouble concentrating 0   Moving slowly or fidgety/restless 0   Suicidal thoughts 0   PHQ-9 Score 2   Difficult doing work/chores Not difficult at all      GEN: well developed, well nourished, no acute distress Eyes: conjunctiva and lids normal, PERRLA, EOMI ENT: TM clear, nares clear, oral exam WNL Neck: supple, no lymphadenopathy, no thyromegaly, no JVD Pulm: clear to auscultation  and percussion, respiratory effort normal CV: regular rate and rhythm, S1-S2, no murmur, rub or gallop, no bruits, peripheral pulses normal and symmetric, no cyanosis, clubbing, edema or varicosities GI: soft, non-tender; no hepatosplenomegaly, masses; active bowel sounds all quadrants GU: deferred Lymph: no cervical, axillary or inguinal adenopathy MSK: gait normal, muscle tone and strength WNL, no joint swelling, effusions, discoloration, crepitus  SKIN: clear, good turgor, color WNL, no rashes, lesions, or ulcerations Neuro: normal mental status, normal strength, sensation, and motion Psych: alert; oriented to person, place and time, normally interactive and not anxious or depressed in appearance.  All labs reviewed with patient. Results for orders placed or performed in visit on 01/27/23  Urine Culture   Specimen: Urine  Result Value Ref Range   MICRO NUMBER: [id number]    SPECIMEN QUALITY: Adequate    Sample Source URINE    STATUS: FINAL    ISOLATE 1: ESBL Escherichia coli (A)       Susceptibility   Esbl escherichia coli - URINE CULTURE, REFLEX    AMOX/CLAVULANIC 16 Intermediate     AMPICILLIN* >=32 Resistant      * Extended spectrum beta-lactamase (ESBL) producing organisms demonstrate decreased activity with penicillins, cephalosporins and aztreonam.     AMPICILLIN/SULBACTAM >=32 Resistant     CEFAZOLIN >=64 Resistant     CEFTAZIDIME 16 Resistant     CEFEPIME 32 Resistant     CEFTRIAXONE >=64 Resistant     CIPROFLOXACIN >=4 Resistant     LEVOFLOXACIN >=8 Resistant     GENTAMICIN >=16 Resistant     IMIPENEM <=0.25 Sensitive     NITROFURANTOIN <=16 Sensitive     PIP/TAZO 8 Sensitive     TOBRAMYCIN >=16 Resistant     TRIMETH/SULFA* <=20 Sensitive      * Extended spectrum beta-lactamase (ESBL) producing organisms demonstrate decreased activity with penicillins, cephalosporins and aztreonam. Legend: S = Susceptible  I = Intermediate R = Resistant  NS = Not susceptible *  = Not tested  NR = Not reported **NN = See antimicrobic comments   PSA  Result Value Ref Range   PSA 1.55 0.10 - 4.00 ng/mL  POCT Urinalysis Dipstick (Automated)  Result Value Ref Range   Color, UA Yellow    Clarity, UA clear    Glucose, UA Negative Negative   Bilirubin, UA Negative    Ketones, UA Negative    Spec Grav, UA 1.015 1.010 - 1.025   Blood, UA +    pH, UA 6.0 5.0 - 8.0   Protein, UA Negative Negative   Urobilinogen, UA 0.2 0.2 or 1.0 E.U./dL   Nitrite, UA Negative    Leukocytes, UA Moderate (2+) (A) Negative    Assessment and Plan:     ICD-10-CM   1. Healthcare maintenance  Z00.00       Health Maintenance Exam: The patient's preventative maintenance and recommended screening tests for an annual wellness exam  were reviewed in full today. Brought up to date unless services declined.  Counselled on the importance of diet, exercise, and its role in overall health and mortality. The patient's FH and SH was reviewed, including their home life, tobacco status, and drug and alcohol status.  Follow-up in 1 year for physical exam or additional follow-up below.  Disposition: No follow-ups on file.  No orders of the defined types were placed in this encounter.  There are no discontinued medications. No orders of the defined types were placed in this encounter.   Signed,  Elpidio Galea. Jacalyn Biggs, MD   Allergies as of 02/09/2023   No Known Allergies      Medication List        Accurate as of February 08, 2023  1:27 PM. If you have any questions, ask your nurse or doctor.          sulfamethoxazole-trimethoprim 800-160 MG tablet Commonly known as: BACTRIM DS Take 1 tablet by mouth 2 (two) times daily.

## 2023-02-09 ENCOUNTER — Ambulatory Visit (INDEPENDENT_AMBULATORY_CARE_PROVIDER_SITE_OTHER): Payer: BC Managed Care – PPO | Admitting: Family Medicine

## 2023-02-09 ENCOUNTER — Encounter: Payer: Self-pay | Admitting: Family Medicine

## 2023-02-09 VITALS — BP 106/70 | HR 86 | Temp 97.7°F | Ht 67.5 in | Wt 179.0 lb

## 2023-02-09 DIAGNOSIS — Z13220 Encounter for screening for lipoid disorders: Secondary | ICD-10-CM

## 2023-02-09 DIAGNOSIS — Z0000 Encounter for general adult medical examination without abnormal findings: Secondary | ICD-10-CM | POA: Diagnosis not present

## 2023-02-09 DIAGNOSIS — Z131 Encounter for screening for diabetes mellitus: Secondary | ICD-10-CM

## 2023-02-09 DIAGNOSIS — R5383 Other fatigue: Secondary | ICD-10-CM

## 2023-02-10 ENCOUNTER — Other Ambulatory Visit (INDEPENDENT_AMBULATORY_CARE_PROVIDER_SITE_OTHER): Payer: BC Managed Care – PPO

## 2023-02-10 DIAGNOSIS — Z131 Encounter for screening for diabetes mellitus: Secondary | ICD-10-CM

## 2023-02-10 DIAGNOSIS — R5383 Other fatigue: Secondary | ICD-10-CM

## 2023-02-10 DIAGNOSIS — Z13220 Encounter for screening for lipoid disorders: Secondary | ICD-10-CM

## 2023-02-10 LAB — CBC WITH DIFFERENTIAL/PLATELET
Basophils Absolute: 0 10*3/uL (ref 0.0–0.1)
Basophils Relative: 0.6 % (ref 0.0–3.0)
Eosinophils Absolute: 0.2 10*3/uL (ref 0.0–0.7)
Eosinophils Relative: 4.2 % (ref 0.0–5.0)
HCT: 50.4 % (ref 39.0–52.0)
Hemoglobin: 17.3 g/dL — ABNORMAL HIGH (ref 13.0–17.0)
Lymphocytes Relative: 31.8 % (ref 12.0–46.0)
Lymphs Abs: 1.6 10*3/uL (ref 0.7–4.0)
MCHC: 34.3 g/dL (ref 30.0–36.0)
MCV: 91.5 fl (ref 78.0–100.0)
Monocytes Absolute: 0.5 10*3/uL (ref 0.1–1.0)
Monocytes Relative: 9.1 % (ref 3.0–12.0)
Neutro Abs: 2.8 10*3/uL (ref 1.4–7.7)
Neutrophils Relative %: 54.3 % (ref 43.0–77.0)
Platelets: 199 10*3/uL (ref 150.0–400.0)
RBC: 5.51 Mil/uL (ref 4.22–5.81)
RDW: 13.6 % (ref 11.5–15.5)
WBC: 5.1 10*3/uL (ref 4.0–10.5)

## 2023-02-10 LAB — BASIC METABOLIC PANEL
BUN: 15 mg/dL (ref 6–23)
CO2: 32 mEq/L (ref 19–32)
Calcium: 9.2 mg/dL (ref 8.4–10.5)
Chloride: 103 mEq/L (ref 96–112)
Creatinine, Ser: 1.17 mg/dL (ref 0.40–1.50)
GFR: 71.29 mL/min (ref >60.00)
Glucose, Bld: 90 mg/dL (ref 70–99)
Potassium: 4.7 mEq/L (ref 3.5–5.1)
Sodium: 139 mEq/L (ref 135–145)

## 2023-02-10 LAB — HEPATIC FUNCTION PANEL
ALT: 25 U/L (ref 0–53)
AST: 22 U/L (ref 0–37)
Albumin: 4.1 g/dL (ref 3.5–5.2)
Alkaline Phosphatase: 50 U/L (ref 39–117)
Bilirubin, Direct: 0.1 mg/dL (ref 0.0–0.3)
Total Bilirubin: 0.8 mg/dL (ref 0.2–1.2)
Total Protein: 6.7 g/dL (ref 6.0–8.3)

## 2023-02-10 LAB — LIPID PANEL
Cholesterol: 176 mg/dL (ref 0–200)
HDL: 50.7 mg/dL (ref >39.00)
LDL Cholesterol: 115 mg/dL — ABNORMAL HIGH (ref 0–99)
NonHDL: 125.08
Total CHOL/HDL Ratio: 3
Triglycerides: 48 mg/dL (ref 0.0–149.0)
VLDL: 9.6 mg/dL (ref 0.0–40.0)

## 2023-02-10 LAB — HEMOGLOBIN A1C: Hgb A1c MFr Bld: 6 % (ref 4.6–6.5)

## 2023-02-28 ENCOUNTER — Telehealth: Payer: Self-pay | Admitting: Family Medicine

## 2023-02-28 DIAGNOSIS — R310 Gross hematuria: Secondary | ICD-10-CM

## 2023-02-28 MED ORDER — SULFAMETHOXAZOLE-TRIMETHOPRIM 800-160 MG PO TABS: 1.0000 | ORAL_TABLET | Freq: Two times a day (BID) | ORAL | 0 refills | Status: DC

## 2023-02-28 NOTE — Telephone Encounter (Signed)
David Norton notified as instructed by telephone.  He would prefer to see a Insurance underwriter in [place].

## 2023-02-28 NOTE — Telephone Encounter (Signed)
OK, done

## 2023-02-28 NOTE — Telephone Encounter (Signed)
I sent him in a refill for his ABX.  We definitely need to get him in to see Urology.  Would he prefer to go to [place] or [place]?

## 2023-02-28 NOTE — Telephone Encounter (Signed)
Patient's wife came by the office, wanted to let Dr. Patsy Lager know that there is till blood in patient's urine. Having same symptoms. States it has been persistent. Wants to now if pt could get refill on abx and possible referral to urologist. Please advise patient AT [phone]. Thank you

## 2023-03-24 ENCOUNTER — Encounter: Payer: Self-pay | Admitting: Urology

## 2023-03-24 ENCOUNTER — Ambulatory Visit: Payer: BC Managed Care – PPO | Admitting: Urology

## 2023-03-24 VITALS — BP 126/79 | HR 79 | Ht 68.0 in | Wt 178.0 lb

## 2023-03-24 DIAGNOSIS — Z87440 Personal history of urinary (tract) infections: Secondary | ICD-10-CM | POA: Diagnosis not present

## 2023-03-24 DIAGNOSIS — R310 Gross hematuria: Secondary | ICD-10-CM | POA: Diagnosis not present

## 2023-03-24 DIAGNOSIS — N410 Acute prostatitis: Secondary | ICD-10-CM

## 2023-03-24 DIAGNOSIS — N390 Urinary tract infection, site not specified: Secondary | ICD-10-CM

## 2023-03-24 LAB — URINALYSIS, COMPLETE
Bilirubin, UA: NEGATIVE
Glucose, UA: NEGATIVE
Ketones, UA: NEGATIVE
Nitrite, UA: NEGATIVE
Protein,UA: NEGATIVE
RBC, UA: NEGATIVE
Specific Gravity, UA: 1.015 (ref 1.005–1.030)
Urobilinogen, Ur: 2 mg/dL — ABNORMAL HIGH (ref 0.2–1.0)
pH, UA: 7.5 (ref 5.0–7.5)

## 2023-03-24 LAB — MICROSCOPIC EXAMINATION: WBC, UA: >30 /hpf — AB (ref 0–5)

## 2023-03-24 MED ORDER — SULFAMETHOXAZOLE-TRIMETHOPRIM 800-160 MG PO TABS: 1.0000 | ORAL_TABLET | Freq: Two times a day (BID) | ORAL | 0 refills | Status: AC

## 2023-03-24 NOTE — Progress Notes (Signed)
I, David Norton,acting as a scribe for Riki Altes, MD.,have documented all relevant documentation on the behalf of Riki Altes, MD,as directed by  Riki Altes, MD while in the presence of Riki Altes, MD.  03/24/2023 10:37 AM   David Norton David Norton Oct 14, 1971 [id number]  Referring provider: Hannah Beat, MD [address] [place],  [place] [address]  Chief Complaint  Patient presents with   Hematuria    HPI: David Norton is a 52 y.o. male here for evaluation of hematuria.  Urgent care visit 01/02/2023 with complaints of urinary frequency and voiding small amounts. Dipstick UA showed small blood and small leukocytes. He was started on Cipro however urine culture grew MDR ESBL E. coli and was switch to Cipro for a 5 day course. Follow up visit 01/27/23 with complaints of dysuria at the end of urination and temp spike to 103.8 He was treated with a 7 day course of Septra DS. Repeat urine culture grew 100,000 colonies MDR E. coli, which was sensitive to Septra. He completed this course of antibiotics. He was treated with an additional 7 day course of Septra for 29/24 for hematuria. Presently denies frequency, urgency, dysuria, or fever. No previous history of recurrent UTI's prior to these recent symptoms.   PMH: Past Medical History:  Diagnosis Date   Medical history non-contributory     Surgical History: Past Surgical History:  Procedure Laterality Date   COLONOSCOPY WITH PROPOFOL N/A 02/24/2022   Procedure: COLONOSCOPY WITH PROPOFOL;  Surgeon: Toney Reil, MD;  Location: [facility] ENDOSCOPY;  Service: Gastroenterology;  Laterality: N/A;   COLONOSCOPY WITH PROPOFOL N/A 02/25/2022   Procedure: COLONOSCOPY WITH PROPOFOL;  Surgeon: Toney Reil, MD;  [facility];  Service: Endoscopy;  Laterality: N/A;   FRACTURE SURGERY     HERNIA REPAIR     POLYPECTOMY  02/25/2022   Procedure: POLYPECTOMY;  Surgeon: Toney Reil, MD;   Location: [facility];  Service: Endoscopy;;   SHOULDER ARTHROSCOPY W/ LABRAL REPAIR Left 08/2016   Dr. Ave Filter    Home Medications:  Allergies as of 03/24/2023   No Known Allergies      Medication List        Accurate as of Mar 24, 2023 10:37 AM. If you have any questions, ask your nurse or doctor.          sulfamethoxazole-trimethoprim 800-160 MG tablet Commonly known as: BACTRIM DS Take 1 tablet by mouth every 12 (twelve) hours. What changed: when to take this Changed by: Riki Altes, MD        Allergies: No Known Allergies  Family History: Family History  Problem Relation Age of Onset   Thyroid disease Mother    Lung cancer Maternal Grandmother     Social History:  reports that he quit smoking about 39 years ago. His smoking use included cigarettes. He has never used smokeless tobacco. He reports current alcohol use. He reports that he does not use drugs.   Physical Exam: BP 126/79   Pulse 79   Ht 5\' 8"  (1.727 m)   Wt 178 lb (80.7 kg)   BMI 27.06 kg/m   Constitutional:  Alert and oriented, No acute distress. HEENT: [place] AT, moist mucus membranes.  Trachea midline, no masses. Cardiovascular: No clubbing, cyanosis, or edema. Respiratory: Normal respiratory effort, no increased work of breathing. Psychiatric: Normal mood and affect.   Urinalysis Microscopy >30 WBC/0-2 RBC.  Assessment & Plan:    1.  Persistent UTI  Febrile UTI early March with temp to 103.8 and we discussed he most likely had acute prostatitis. We discussed a 7 day course of antibiotic therapy is not long enough to achieve adequate prostate tissue levels and a 3-4 week course of antibiotic is recommended for suspected acute prostatitis. Urine culture repeated today. Rx Septra DS twice daily x28 days. Scheduled CT urogram. If he remains asymptomatic, lab visit for repeat UA and urine C&S in 6 weeks.  I have reviewed the above documentation for accuracy and completeness,  and I agree with the above.   Riki Altes, MD  [facility] [address] [place], [place] [address] [phone]

## 2023-03-30 LAB — CULTURE, URINE COMPREHENSIVE

## 2023-04-18 ENCOUNTER — Ambulatory Visit: Payer: BC Managed Care – PPO

## 2023-05-12 ENCOUNTER — Other Ambulatory Visit: Payer: BC Managed Care – PPO

## 2023-05-12 ENCOUNTER — Other Ambulatory Visit: Payer: Self-pay

## 2023-05-12 DIAGNOSIS — N410 Acute prostatitis: Secondary | ICD-10-CM

## 2023-05-12 DIAGNOSIS — R310 Gross hematuria: Secondary | ICD-10-CM

## 2023-05-12 LAB — URINALYSIS, COMPLETE
Bilirubin, UA: NEGATIVE
Glucose, UA: NEGATIVE
Ketones, UA: NEGATIVE
Leukocytes,UA: NEGATIVE
Nitrite, UA: NEGATIVE
Protein,UA: NEGATIVE
RBC, UA: NEGATIVE
Specific Gravity, UA: 1.02 (ref 1.005–1.030)
Urobilinogen, Ur: 0.2 mg/dL (ref 0.2–1.0)
pH, UA: 6.5 (ref 5.0–7.5)

## 2023-05-12 LAB — MICROSCOPIC EXAMINATION

## 2023-05-17 LAB — CULTURE, URINE COMPREHENSIVE
